# Patient Record
Sex: Male | Born: 1980 | Race: White | Hispanic: No | Marital: Married | Smoking: Former smoker
Health system: Southern US, Community
[De-identification: ages and names within clinical notes are randomized; demographics above are authoritative.]

## PROBLEM LIST (undated history)

## (undated) DIAGNOSIS — N529 Male erectile dysfunction, unspecified: Secondary | ICD-10-CM

## (undated) DIAGNOSIS — Z87442 Personal history of urinary calculi: Secondary | ICD-10-CM

## (undated) DIAGNOSIS — R319 Hematuria, unspecified: Secondary | ICD-10-CM

## (undated) DIAGNOSIS — G43909 Migraine, unspecified, not intractable, without status migrainosus: Secondary | ICD-10-CM

## (undated) HISTORY — PX: ELBOW SURGERY: SHX618

## (undated) HISTORY — PX: APPENDECTOMY: SHX54

## (undated) HISTORY — PX: TONSILLECTOMY: SUR1361

## (undated) HISTORY — DX: Hematuria, unspecified: R31.9

## (undated) HISTORY — DX: Male erectile dysfunction, unspecified: N52.9

---

## 2014-02-18 ENCOUNTER — Emergency Department: Payer: Self-pay | Admitting: Emergency Medicine

## 2014-05-08 ENCOUNTER — Encounter: Payer: Self-pay | Admitting: Family Medicine

## 2014-05-08 DIAGNOSIS — S20219A Contusion of unspecified front wall of thorax, initial encounter: Secondary | ICD-10-CM | POA: Insufficient documentation

## 2014-05-08 DIAGNOSIS — S161XXA Strain of muscle, fascia and tendon at neck level, initial encounter: Secondary | ICD-10-CM | POA: Insufficient documentation

## 2014-06-24 ENCOUNTER — Ambulatory Visit
Admission: EM | Admit: 2014-06-24 | Discharge: 2014-06-24 | Disposition: A | Discharge status: Home / Self Care | Payer: BLUE CROSS/BLUE SHIELD | Attending: Internal Medicine | Admitting: Internal Medicine

## 2014-06-24 DIAGNOSIS — F17210 Nicotine dependence, cigarettes, uncomplicated: Secondary | ICD-10-CM | POA: Diagnosis not present

## 2014-06-24 DIAGNOSIS — R350 Frequency of micturition: Secondary | ICD-10-CM | POA: Diagnosis not present

## 2014-06-24 DIAGNOSIS — R312 Other microscopic hematuria: Secondary | ICD-10-CM | POA: Diagnosis not present

## 2014-06-24 DIAGNOSIS — R3129 Other microscopic hematuria: Secondary | ICD-10-CM

## 2014-06-24 LAB — URINALYSIS COMPLETE WITH MICROSCOPIC (ARMC ONLY)
BACTERIA UA: NONE SEEN — AB
BILIRUBIN URINE: NEGATIVE
Glucose, UA: NEGATIVE mg/dL
Ketones, ur: NEGATIVE mg/dL
LEUKOCYTES UA: NEGATIVE
Nitrite: NEGATIVE
PROTEIN: NEGATIVE mg/dL
SQUAMOUS EPITHELIAL / LPF: NONE SEEN — AB
Specific Gravity, Urine: 1.02 (ref 1.005–1.030)
pH: 8.5 — ABNORMAL HIGH (ref 5.0–8.0)

## 2014-06-24 LAB — GLUCOSE, CAPILLARY: Glucose-Capillary: 83 mg/dL (ref 65–99)

## 2014-06-24 NOTE — ED Provider Notes (Signed)
CSN: [id number]     Arrival date & time 06/24/14  1521 History   First MD Initiated Contact with Patient 06/24/14 1621     Chief Complaint  Patient presents with  . Urinary Frequency   HPI   Well patient, who presents with 3 day history of increased urinary frequency, describes a nearly constant urge to void. He estimates that he has voided 70-80 times in the last 3 or 4 days. The frequency seems to be worse at night. He is drinking a lot of fluids, says that he increased fluids in response to the urinary urgency. No pelvic pressure or discomfort. No dysuria. No hematuria, urine is not cloudy, urine has no odor. No change in bowel movements. No fever. No back pain, no flank pain. He does have fatigue, which he attributes to being up all night going to the bathroom. He has not changed hygiene products. He has decreased caffeine intake; at symptom onset, he was drinking 5-6 "monster" beverages a day. Over-the-counter Azo has not been helpful. He denies perineal rash, penile drainage, scrotal swelling/pain. A grandfather had a lot of trouble with kidney stones. Patient denies new sexual partners, is monogamous with a single male partner and has been for a couple of years.  No past medical history on file. Past Surgical History  Procedure Laterality Date  . Appendectomy    . Tonsillectomy    . Elbow surgery     History reviewed. No pertinent family history. History  Substance Use Topics  . Smoking status: Current Every Day Smoker -- 1.00 packs/day    Types: Cigarettes  . Smokeless tobacco: Not on file  . Alcohol Use: Yes    Review of Systems  All other systems reviewed and are negative.   Allergies  Penicillins and Amoxicillin  Home Medications   Prior to Admission medications   Medication Sig Start Date End Date Taking? Authorizing Provider  phenazopyridine (PYRIDIUM) 95 MG tablet Take 95 mg by mouth 3 (three) times daily as needed for pain.   Yes Historical Provider, MD   cyclobenzaprine (FLEXERIL) 10 MG tablet Take 1 tablet by mouth at bedtime as needed. 03/06/14   Historical Provider, MD  traMADol (ULTRAM) 50 MG tablet Take 1 tablet by mouth every 6 (six) hours as needed. 03/06/14   Historical Provider, MD   There were no vitals taken for this visit. Physical Exam  Constitutional: He is oriented to person, place, and time. No distress.  Alert, nicely groomed  HENT:  Head: Atraumatic.  Eyes:  Conjugate gaze, no eye redness/drainage  Neck: Neck supple.  Cardiovascular: Normal rate and regular rhythm.   Pulmonary/Chest: No respiratory distress. He has no wheezes. He has no rales.  Lungs clear, symmetric breath sounds  Abdominal: Soft. He exhibits no distension. There is no tenderness. There is no guarding.  Genitourinary:  Moderately enlarged, symmetric prostate, no heat, no bogginess, nontender to palpation  Musculoskeletal: Normal range of motion.  Neurological: He is alert and oriented to person, place, and time.  Skin: Skin is warm and dry.  No cyanosis  Nursing note and vitals reviewed.   ED Course  Procedures (including critical care time) Labs Review Labs Reviewed  URINALYSIS COMPLETEWITH MICROSCOPIC ([facility] ONLY) - Abnormal; Notable for the following:    APPearance CLOUDY (*)    Hgb urine dipstick TRACE (*)    pH 8.5 (*)    Bacteria, UA NONE SEEN (*)    Squamous Epithelial / LPF NONE SEEN (*)    All  other components within normal limits  URINE CULTURE  CHLAMYDIA/NGC RT PCR ([facility] ONLY)  GLUCOSE, CAPILLARY  CBG MONITORING, ED   urine micro:  notable for 6-30 RBCs, urine culture is pending  Dirty urine GC/chlamydia assay is pending.  Fingerstick blood glucose 83 (normal)  Imaging Review No results found.   MDM   1. Urinary frequency   2. Microscopic hematuria    Differential diagnosis of urinary frequency includes BPH, bladder irritant, diabetes insipidus, less likely UTI, urethritis (STD). The hematuria suggests  perhaps possible passage of a calculus. Patient should continue to limit caffeine intake, and push fluids. He should follow up with urology for further evaluation. [facility] provided. Urine culture and urine STD testing are pending.    Eustace MooreLaura W Tameyah Koch, MD 06/24/14 [phone]

## 2014-06-24 NOTE — ED Notes (Signed)
Pt states " I have a constant bladder pressure, the urge to pee. I do not have pain but I have urinated 70-80 times in the last 3-4 days."

## 2014-06-24 NOTE — Discharge Instructions (Signed)
Call for appointment with urologist in a week or two, to discuss urinary frequency and recheck urine for microscopic blood.  Push fluids and decrease caffeine (as you are doing).    Urinary Frequency The number of times a normal person urinates depends upon how much liquid they take in and how much liquid they are losing. If the temperature is hot and there is high humidity, then the person will sweat more and usually breathe a little more frequently. These factors decrease the amount of frequency of urination that would be considered normal. The amount you drink is easily determined, but the amount of fluid lost is sometimes more difficult to calculate.  Fluid is lost in two ways:  Sensible fluid loss is usually measured by the amount of urine that you get rid of. Losses of fluid can also occur with diarrhea.  Insensible fluid loss is more difficult to measure. It is caused by evaporation. Insensible loss of fluid occurs through breathing and sweating. It usually ranges from a little less than a quart to a little more than a quart of fluid a day. In normal temperatures and activity levels, the average person may urinate 4 to 7 times in a 24-hour period. Needing to urinate more often than that could indicate a problem. If one urinates 4 to 7 times in 24 hours and has large volumes each time, that could indicate a different problem from one who urinates 4 to 7 times a day and has small volumes. The time of urinating is also important. Most urinating should be done during the waking hours. Getting up at night to urinate frequently can indicate some problems. CAUSES  The bladder is the organ in your lower abdomen that holds urine. Like a balloon, it swells some as it fills up. Your nerves sense this and tell you it is time to head for the bathroom. There are a number of reasons that you might feel the need to urinate more often than usual. They include:  Urinary tract infection. This is usually associated  with other signs such as burning when you urinate.  In men, problems with the prostate (a walnut-size gland that is located near the tube that carries urine out of your body). There are two reasons why the prostate can cause an increased frequency of urination:  An enlarged prostate that does not let the bladder empty well. If the bladder only half empties when you urinate, then it only has half the capacity to fill before you have to urinate again.  The nerves in the bladder become more hypersensitive with an increased size of the prostate even if the bladder empties completely.  Pregnancy.  Obesity. Excess weight is more likely to cause a problem for women than for men.  Bladder stones or other bladder problems.  Caffeine.  Alcohol.  Medications. For example, drugs that help the body get rid of extra fluid (diuretics) increase urine production. Some other medicines must be taken with lots of fluids.  Muscle or nerve weakness. This might be the result of a spinal cord injury, a stroke, multiple sclerosis, or Parkinson disease.  Long-standing diabetes can decrease the sensation of the bladder. This loss of sensation makes it harder to sense the bladder needs to be emptied. Over a period of years, the bladder is stretched out by constant overfilling. This weakens the bladder muscles so that the bladder does not empty well and has less capacity to fill with new urine.  Interstitial cystitis (also called painful  bladder syndrome). This condition develops because the tissues that line the inside of the bladder are inflamed (inflammation is the body's way of reacting to injury or infection). It causes pain and frequent urination. It occurs in women more often than in men. DIAGNOSIS   To decide what might be causing your urinary frequency, your health care provider will probably:  Ask about symptoms you have noticed.  Ask about your overall health. This will include questions about any  medications you are taking.  Do a physical examination.  Order some tests. These might include:  A blood test to check for diabetes or other health issues that could be contributing to the problem.  Urine testing. This could measure the flow of urine and the pressure on the bladder.  A test of your neurological system (the brain, spinal cord, and nerves). This is the system that senses the need to urinate.  A bladder test to check whether it is emptying completely when you urinate.  Cystoscopy. This test uses a thin tube with a tiny camera on it. It offers a look inside your urethra and bladder to see if there are problems.  Imaging tests. You might be given a contrast dye and then asked to urinate. X-rays are taken to see how your bladder is working. TREATMENT  It is important for you to be evaluated to determine if the amount or frequency that you have is unusual or abnormal. If it is found to be abnormal, the cause should be determined and this can usually be found out easily. Depending upon the cause, treatment could include medication, stimulation of the nerves, or surgery. There are not too many things that you can do as an individual to change your urinary frequency. It is important that you balance the amount of fluid intake needed to compensate for your activity and the temperature. Medical problems will be diagnosed and taken care of by your physician. There is no particular bladder training such as Kegel exercises that you can do to help urinary frequency. This is an exercise that is usually recommended for people who have leaking of urine when they laugh, cough, or sneeze. HOME CARE INSTRUCTIONS   Take any medications your health care provider prescribed or suggested. Follow the directions carefully.  Practice any lifestyle changes that are recommended. These might include:  Drinking less fluid or drinking at different times of the day. If you need to urinate often during the  night, for example, you may need to stop drinking fluids early in the evening.  Cutting down on caffeine or alcohol. They both can make you need to urinate more often than normal. Caffeine is found in coffee, tea, and sodas.  Losing weight, if that is recommended.  Keep a journal or a log. You might be asked to record how much you drink and when and where you feel the need to urinate. This will also help evaluate how well the treatment provided by your physician is working. SEEK MEDICAL CARE IF:   Your need to urinate often gets worse.  You feel increased pain or irritation when you urinate.  You notice blood in your urine.  You have questions about any medications that your health care provider recommended.  You notice blood, pus, or swelling at the site of any test or treatment procedure.  You develop a fever of more than 100.35F (38.1C). SEEK IMMEDIATE MEDICAL CARE IF:  You develop a fever of more than 102.60F (38.9C). Document Released: 10/30/2008 Document Revised:  05/20/2013 Document Reviewed: 10/30/2008 ExitCare Patient Information 2015 [employer] [place]. This information is not intended to replace advice given to you by your health care provider. Make sure you discuss any questions you have with your health care provider.

## 2014-06-25 LAB — CHLAMYDIA/NGC RT PCR (ARMC ONLY)
CHLAMYDIA TR: NOT DETECTED
N gonorrhoeae: NOT DETECTED

## 2014-06-26 ENCOUNTER — Telehealth: Payer: Self-pay | Admitting: Urology

## 2014-06-26 LAB — URINE CULTURE: CULTURE: NO GROWTH

## 2014-06-26 NOTE — Telephone Encounter (Signed)
Pt called wanting to be seen ASAP (getting married next week). He had gone to the ER and was diagnosed with frequent urination and micro hemo. He is also starting to have a symptom of pain, feels like being punched in the side a couple days earlier, near where he believes is his kidney. I have scheduled him with Zara Council PA tomorrow @ 9:30am.  06/26/14 maf

## 2014-06-27 ENCOUNTER — Encounter: Payer: Self-pay | Admitting: Urology

## 2014-06-27 ENCOUNTER — Ambulatory Visit (INDEPENDENT_AMBULATORY_CARE_PROVIDER_SITE_OTHER): Payer: BLUE CROSS/BLUE SHIELD | Admitting: Urology

## 2014-06-27 VITALS — BP 120/70 | HR 67 | Ht 71.0 in | Wt 251.1 lb

## 2014-06-27 DIAGNOSIS — R350 Frequency of micturition: Secondary | ICD-10-CM

## 2014-06-27 DIAGNOSIS — R109 Unspecified abdominal pain: Secondary | ICD-10-CM

## 2014-06-27 DIAGNOSIS — R312 Other microscopic hematuria: Secondary | ICD-10-CM | POA: Diagnosis not present

## 2014-06-27 DIAGNOSIS — R3129 Other microscopic hematuria: Secondary | ICD-10-CM

## 2014-06-27 DIAGNOSIS — R1011 Right upper quadrant pain: Secondary | ICD-10-CM | POA: Diagnosis not present

## 2014-06-27 LAB — URINALYSIS, COMPLETE
Bilirubin, UA: NEGATIVE
Glucose, UA: NEGATIVE
Ketones, UA: NEGATIVE
Leukocytes, UA: NEGATIVE
Nitrite, UA: NEGATIVE
PH UA: 5.5 (ref 5.0–7.5)
Protein, UA: NEGATIVE
SPEC GRAV UA: 1.02 (ref 1.005–1.030)
Urobilinogen, Ur: 0.2 mg/dL (ref 0.2–1.0)

## 2014-06-27 LAB — MICROSCOPIC EXAMINATION: Bacteria, UA: NONE SEEN

## 2014-06-27 MED ORDER — TAMSULOSIN HCL 0.4 MG PO CAPS: 0.4000 mg | ORAL_CAPSULE | Freq: Every day | ORAL | Status: DC

## 2014-06-27 NOTE — Patient Instructions (Signed)
Patient is scheduled for a CT Urogram on 06/30/2014 @ 2:30pm.  He is not to eat anything 4 hours prior to the scan.  He is given the # for radiology to reschedule the appointment.

## 2014-06-27 NOTE — Progress Notes (Signed)
06/27/2014 12:48 PM   Glenn Serrano Glenn Serrano 1980-03-17 [id number]  Referring provider: No referring provider defined for this encounter.  Chief Complaint  Patient presents with  . Urinary Frequency  . Hematuria    microscopic  . Flank Pain    patient states pain near kidney (right side)    HPI: Glenn Serrano is a 34 y/o white male who presents with the c/o urinary frequency that started 5 days ago.  He states he was awakened with a strong urge to void.  The frequency persisted throughout the night and increased the next day.  He was evaluated at [facility] on 06/24/2014 and was found to have microscopic hematuria.  His Cx's were negative for UTI and GC/Chlamydia.  He was then referred to [place].  He states two days after the frequency started, he developed right flank pain.  He is also experiencing nocturia, intermittency, hesitancy and a weak stream.  He has ED.  He does not have associated fevers, chills, [place] or visiting.  He denies any gross hematuria or prior h/o stones.   His grandfather and father have a h/o kidney stones.     PMH: Past Medical History  Diagnosis Date  . Hematuria   . Erectile dysfunction     Surgical History: Past Surgical History  Procedure Laterality Date  . Appendectomy    . Tonsillectomy    . Elbow surgery      Home Medications:    Medication List       This list is accurate as of: 06/27/14 12:48 PM.  Always use your most recent med list.               acetaminophen 160 MG chewable tablet  Commonly known as:  TYLENOL  Chew 160 mg by mouth every 6 (six) hours as needed for pain.     cyclobenzaprine 10 MG tablet  Commonly known as:  FLEXERIL  Take 1 tablet by mouth at bedtime as needed.     diazepam 2 MG tablet  Commonly known as:  VALIUM  Take by mouth.     HYDROcodone-acetaminophen 5-325 MG per tablet  Commonly known as:  NORCO/VICODIN  Take by mouth.     naproxen 500 MG tablet  Commonly known as:  NAPROSYN  Take by  mouth.     phenazopyridine 95 MG tablet  Commonly known as:  PYRIDIUM  Take 95 mg by mouth 3 (three) times daily as needed for pain.     predniSONE 10 MG (21) Tbpk tablet  Commonly known as:  STERAPRED UNI-PAK 21 TAB  6 day taper. Take as directed with food     tamsulosin 0.4 MG Caps capsule  Commonly known as:  FLOMAX  Take 1 capsule (0.4 mg total) by mouth daily.     traMADol 50 MG tablet  Commonly known as:  ULTRAM  Take 1 tablet by mouth every 6 (six) hours as needed.        Allergies:  Allergies  Allergen Reactions  . Penicillins Hives  . Amoxicillin Rash    Other reaction(s): Difficulty breathing    Family History: Family History  Problem Relation Age of Onset  . Hematuria      paternal  . Kidney cancer      paternal  . Prostate cancer      paternal  . Kidney failure      paternal    Social History:  reports that he has been smoking Cigarettes.  He has been  smoking about 1.00 pack per day. He does not have any smokeless tobacco history on file. He reports that he drinks alcohol. He reports that he does not use illicit drugs.  ROS: Urological Symptom Review  Patient is experiencing the following symptoms: Frequent urination Get up at night to urinate Stream starts and stops Trouble starting stream Blood in urine Weak stream Erection problems (male only)   Review of Systems  Gastrointestinal (upper)  : Negative for upper GI symptoms  Gastrointestinal (lower) : Diarrhea  Constitutional : Negative for symptoms  Skin: Negative for skin symptoms  Eyes: Negative for eye symptoms  Ear/Nose/Throat : Negative for Ear/Nose/Throat symptoms  Hematologic/Lymphatic: Negative for Hematologic/Lymphatic symptoms  Cardiovascular : Negative for cardiovascular symptoms  Respiratory : Negative for respiratory symptoms  Endocrine: Negative for endocrine symptoms  Musculoskeletal: Negative for musculoskeletal symptoms  Neurological: Negative  for neurological symptoms  Psychologic: Negative for psychiatric symptoms   Physical Exam: BP 120/70 mmHg  Pulse 67  Ht 5\' 11"  (1.803 m)  Wt 251 lb 1.6 oz (113.898 kg)  BMI 35.04 kg/m2  Constitutional:  Alert and oriented, No acute distress. HEENT: [place] AT, moist mucus membranes.  Trachea midline, no masses. Cardiovascular: No clubbing, cyanosis, or edema. Respiratory: Normal respiratory effort, no increased work of breathing. GI: Abdomen is soft, non tender, non distended, no abdominal masses GU: Right CVA tenderness.  Patient with a circumcised phallus.  Urethral meatus is patent.  No penile discharge. No penile lesions or rashes. Scrotum without lesions, cysts, rashes and/or edema.  Testicles are located scrotally bilaterally. No masses are appreciated in the testicles. Left and right epididymis are normal. Rectal: Patient with  normal sphincter tone. Perineum without scarring or rashes. No rectal masses are appreciated. Prostate is approximately 35 grams, no nodules are appreciated. Seminal vesicles are normal. Skin: No rashes, bruises or suspicious lesions. Lymph: No cervical or inguinal adenopathy. Neurologic: Grossly intact, no focal deficits, moving all 4 extremities. Psychiatric: Normal mood and affect.  Laboratory Data: No results found for: WBC, HGB, HCT, MCV, PLT  No results found for: CREATININE  No results found for: PSA  No results found for: TESTOSTERONE  No results found for: HGBA1C  Urinalysis Results for orders placed or performed in visit on 06/27/14  Microscopic Examination  Result Value Ref Range   WBC, UA 0-5 0 -  5 /hpf   RBC, UA 3-10 (A) 0 -  2 /hpf   Epithelial Cells (non renal) 0-10 0 - 10 /hpf   Bacteria, UA None seen None seen/Few  Urinalysis, Complete  Result Value Ref Range   Specific Gravity, UA 1.020 1.005 - 1.030   pH, UA 5.5 5.0 - 7.5   Color, UA Yellow Yellow   Appearance Ur Clear Clear   Leukocytes, UA Negative Negative   Protein,  UA Negative Negative/Trace   Glucose, UA Negative Negative   Ketones, UA Negative Negative   RBC, UA Trace (A) Negative   Bilirubin, UA Negative Negative   Urobilinogen, Ur 0.2 0.2 - 1.0 mg/dL   Nitrite, UA Negative Negative   Microscopic Examination See below:    Pertinent Imaging:   Assessment & Plan:    1. Microscopic hematuria- Patient had 6-30RBCs/hpf on 06/24/2014 and 3-10RBCs/hpf with [place] today.  Explained to patient the causes of blood in the urine are as follows: stones, BPH, UTI's, damage to the urinary tract and/or cancer.  It is explained to the patient that they will be scheduled for a CT Urogram with contrast material  and that in rare instances, an allergic reaction can be serious and even life threatening with the injection of contrast material.   The patient denies any allergies to contrast, iodine and/or seafood and is not taking metformin.  - Urinalysis, Complete  2. Right flank pain-  Patient has a family h/o stones.  He has not had a stone in the past.  His flank pain and micro heme are most likely due to a kidney stone.  I have given him a strainer and instructed him to strain his urine.  If he should capture a fragment, he will bring it into the office for analysis.  I have also prescribed him tamsulosin to help with medical expulsion of a possible stone.    Return in about 1 week (around 07/04/2014) for CT report.  Michiel Cowboy, PA-C  [facility] [address], [address] [place], [place] [address] [phone]   sz

## 2014-06-30 ENCOUNTER — Other Ambulatory Visit: Payer: Self-pay | Admitting: Urology

## 2014-06-30 ENCOUNTER — Ambulatory Visit
Admission: RE | Admit: 2014-06-30 | Discharge: 2014-06-30 | Disposition: A | Discharge status: Home / Self Care | Payer: BLUE CROSS/BLUE SHIELD | Source: Ambulatory Visit | Attending: Urology | Admitting: Urology

## 2014-06-30 DIAGNOSIS — R3129 Other microscopic hematuria: Secondary | ICD-10-CM

## 2014-06-30 DIAGNOSIS — R312 Other microscopic hematuria: Secondary | ICD-10-CM | POA: Diagnosis present

## 2014-06-30 MED ORDER — IOHEXOL 350 MG/ML SOLN
150.0000 mL | Freq: Once | INTRAVENOUS | Status: AC | PRN
  Administered 2014-06-30: 150 mL via INTRAVENOUS

## 2014-07-01 ENCOUNTER — Encounter: Payer: Self-pay | Admitting: Urology

## 2014-07-01 ENCOUNTER — Ambulatory Visit (INDEPENDENT_AMBULATORY_CARE_PROVIDER_SITE_OTHER): Payer: BLUE CROSS/BLUE SHIELD | Admitting: Urology

## 2014-07-01 VITALS — BP 107/67 | HR 93 | Ht 70.5 in | Wt 253.1 lb

## 2014-07-01 DIAGNOSIS — R3129 Other microscopic hematuria: Secondary | ICD-10-CM

## 2014-07-01 DIAGNOSIS — R312 Other microscopic hematuria: Secondary | ICD-10-CM | POA: Diagnosis not present

## 2014-07-01 DIAGNOSIS — K7689 Other specified diseases of liver: Secondary | ICD-10-CM

## 2014-07-01 DIAGNOSIS — K769 Liver disease, unspecified: Secondary | ICD-10-CM

## 2014-07-01 DIAGNOSIS — N201 Calculus of ureter: Secondary | ICD-10-CM

## 2014-07-01 LAB — URINALYSIS, COMPLETE
Bilirubin, UA: NEGATIVE
Glucose, UA: NEGATIVE
LEUKOCYTES UA: NEGATIVE
NITRITE UA: NEGATIVE
Urobilinogen, Ur: 0.2 mg/dL (ref 0.2–1.0)
pH, UA: 6 (ref 5.0–7.5)

## 2014-07-01 LAB — MICROSCOPIC EXAMINATION

## 2014-07-01 MED ORDER — HYDROCODONE-IBUPROFEN 7.5-200 MG PO TABS: 1.0000 | ORAL_TABLET | Freq: Three times a day (TID) | ORAL | Status: DC | PRN

## 2014-07-01 NOTE — Progress Notes (Signed)
11:05 PM   Glenn Serrano 1980/03/27 [id number]  Referring provider: No referring provider defined for this encounter.  Chief Complaint  Patient presents with  . CT results    Patient states in pain    HPI: Glenn Serrano is a 34 y/o white male who presented with the c/o urinary frequency that started one week ago.  He states he was awakened with a strong urge to void.  The frequency persisted throughout the night and increased the next day.  He was evaluated at [facility] on 06/24/2014 and was found to have microscopic hematuria.  His Cx's were negative for UTI and GC/Chlamydia.  He was then referred to [place].  He states two days after the frequency started, he developed right flank pain.  He was also experiencing nocturia, intermittency, hesitancy and a weak stream.    Patient had 6-30RBCs/hpf on 06/24/2014 and 3-10RBCs/hpf with [place] at his last visit.  He underwent CT Urogram for further evaluation of his pain and microscopic hematuria.    He presents today to discuss the findings. He is having intense right groin pain and nausea.  He vomited yesterday during the CT Urogram.  CT scan demonstrated a right distal 3mm ureteral stone with mild hydronephrosis.  It also noted a lesion in his liver.    Patient is getting married on Friday and getting on a plane to [place] for a honeymoon.    PMH: Past Medical History  Diagnosis Date  . Hematuria   . Erectile dysfunction     Surgical History: Past Surgical History  Procedure Laterality Date  . Appendectomy    . Tonsillectomy    . Elbow surgery      Home Medications:    Medication List       This list is accurate as of: 07/01/14 11:59 PM.  Always use your most recent med list.               acetaminophen 160 MG chewable tablet  Commonly known as:  TYLENOL  Chew 160 mg by mouth every 6 (six) hours as needed for pain.     cyclobenzaprine 10 MG tablet  Commonly known as:  FLEXERIL  Take 1 tablet by mouth at  bedtime as needed.     diazepam 2 MG tablet  Commonly known as:  VALIUM  Take by mouth.     HYDROcodone-ibuprofen 7.5-200 MG per tablet  Commonly known as:  VICOPROFEN  Take 1 tablet by mouth every 8 (eight) hours as needed for moderate pain.     naproxen 500 MG tablet  Commonly known as:  NAPROSYN  Take by mouth.     phenazopyridine 95 MG tablet  Commonly known as:  PYRIDIUM  Take 95 mg by mouth 3 (three) times daily as needed for pain.     predniSONE 10 MG (21) Tbpk tablet  Commonly known as:  STERAPRED UNI-PAK 21 TAB  6 day taper. Take as directed with food     tamsulosin 0.4 MG Caps capsule  Commonly known as:  FLOMAX  Take 1 capsule (0.4 mg total) by mouth daily.     traMADol 50 MG tablet  Commonly known as:  ULTRAM  Take 1 tablet by mouth every 6 (six) hours as needed.        Allergies:  Allergies  Allergen Reactions  . Penicillins Hives  . Amoxicillin Rash    Other reaction(s): Difficulty breathing    Family History: Family History  Problem Relation Age  of Onset  . Hematuria      paternal  . Kidney cancer      paternal  . Prostate cancer      paternal  . Kidney failure      paternal    Social History:  reports that he has been smoking Cigarettes.  He has been smoking about 1.00 pack per day. He does not have any smokeless tobacco history on file. He reports that he drinks alcohol. He reports that he does not use illicit drugs.  ROS: Urological Symptom Review  Patient is experiencing the following symptoms: Frequent urination Get up at night to urinate Stream starts and stops Trouble starting stream Blood in urine Weak stream Erection problems (male only)   Review of Systems  Gastrointestinal (upper)  : Negative for upper GI symptoms  Gastrointestinal (lower) : Diarrhea  Constitutional : Negative for symptoms  Skin: Negative for skin symptoms  Eyes: Negative for eye symptoms  Ear/Nose/Throat : Negative for Ear/Nose/Throat  symptoms  Hematologic/Lymphatic: Negative for Hematologic/Lymphatic symptoms  Cardiovascular : Negative for cardiovascular symptoms  Respiratory : Negative for respiratory symptoms  Endocrine: Negative for endocrine symptoms  Musculoskeletal: Negative for musculoskeletal symptoms  Neurological: Negative for neurological symptoms  Psychologic: Negative for psychiatric symptoms   Physical Exam: BP 107/67 mmHg  Pulse 93  Ht 5' 10.5" (1.791 m)  Wt 253 lb 1.6 oz (114.805 kg)  BMI 35.79 kg/m2   Laboratory Data: Results for orders placed or performed in visit on 07/01/14  Microscopic Examination  Result Value Ref Range   WBC, UA 0-5 0 -  5 /hpf   RBC, UA 3-10 (A) 0 -  2 /hpf   Epithelial Cells (non renal) 0-10 0 - 10 /hpf   Mucus, UA Present (A) Not Estab.   Bacteria, UA Few None seen/Few  Urinalysis, Complete  Result Value Ref Range   Specific Gravity, UA >1.030 (H) 1.005 - 1.030   pH, UA 6.0 5.0 - 7.5   Color, UA Yellow Yellow   Appearance Ur Clear Clear   Leukocytes, UA Negative Negative   Protein, UA 1+ (A) Negative/Trace   Glucose, UA Negative Negative   Ketones, UA Trace (A) Negative   RBC, UA Trace (A) Negative   Bilirubin, UA Negative Negative   Urobilinogen, Ur 0.2 0.2 - 1.0 mg/dL   Nitrite, UA Negative Negative   Microscopic Examination See below:    No results found for: WBC, HGB, HCT, MCV, PLT  No results found for: CREATININE  No results found for: PSA  No results found for: TESTOSTERONE  No results found for: HGBA1C  Urinalysis Results for orders placed or performed in visit on 07/01/14  Microscopic Examination  Result Value Ref Range   WBC, UA 0-5 0 -  5 /hpf   RBC, UA 3-10 (A) 0 -  2 /hpf   Epithelial Cells (non renal) 0-10 0 - 10 /hpf   Mucus, UA Present (A) Not Estab.   Bacteria, UA Few None seen/Few  Urinalysis, Complete  Result Value Ref Range   Specific Gravity, UA >1.030 (H) 1.005 - 1.030   pH, UA 6.0 5.0 - 7.5   Color, UA  Yellow Yellow   Appearance Ur Clear Clear   Leukocytes, UA Negative Negative   Protein, UA 1+ (A) Negative/Trace   Glucose, UA Negative Negative   Ketones, UA Trace (A) Negative   RBC, UA Trace (A) Negative   Bilirubin, UA Negative Negative   Urobilinogen, Ur 0.2 0.2 - 1.0 mg/dL  Nitrite, UA Negative Negative   Microscopic Examination See below:    Pertinent Imaging: CLINICAL DATA: 34 year old male with recent history of urinary urgency. Microscopic hematuria. Right-sided flank pain with radiation to the bladder and bilateral testicles for the past 3-4 days. Dysuria.  EXAM: CT ABDOMEN AND PELVIS WITHOUT AND WITH CONTRAST  TECHNIQUE: Multidetector CT imaging of the abdomen and pelvis was performed following the standard protocol before and following the bolus administration of intravenous contrast.  CONTRAST: OMNIPAQUE IOHEXOL 350 MG/ML SOLN  COMPARISON: No priors.  FINDINGS: Lower chest: Unremarkable.  Hepatobiliary: There is a poorly defined low-attenuation lesion in segment 7 of the liver (image 17 of series 8) which measures approximately 2.6 x 1.5 cm and appears to have some subtle peripheral nodular enhancement, presumably a cavernous hemangioma. No other suspicious appearing hepatic lesions are noted. Mild diffuse low attenuation throughout the hepatic parenchyma, compatible with hepatic steatosis. No intra or extrahepatic biliary ductal dilatation. Gallbladder is normal in appearance.  Pancreas: No pancreatic mass. No pancreatic ductal dilatation. No pancreatic or peripancreatic fluid or inflammatory changes.  Spleen: Unremarkable.  Adrenals/Urinary Tract: Image 84 of series 2 demonstrates a 3 mm calculus in the distal third of the right ureter immediately before the right ureterovesicular junction. This is associated with mild proximal hydroureteronephrosis, and a delayed right nephrogram, indicative of obstruction. No additional calculi  are identified within the collecting system of either kidney, along the course of the left ureter, or within the lumen of the urinary bladder. The appearance of the kidneys is otherwise normal. Bilateral adrenal glands are normal in appearance.  Stomach/Bowel: The stomach is normal in appearance. No pathologic dilatation of small bowel or colon.  Vascular/Lymphatic: No significant atherosclerotic disease, aneurysm or dissection identified in the abdominal or pelvic vasculature. No lymphadenopathy noted in the abdomen or pelvis.  Reproductive: Prostate gland and seminal vesicles are unremarkable in appearance.  Other: No significant volume of ascites. No pneumoperitoneum.  Musculoskeletal: There are no aggressive appearing lytic or blastic lesions noted in the visualized portions of the skeleton.  IMPRESSION: 1. 3 mm obstructive calculus in the distal third of the right ureter shortly before the right ureterovesicular junction with mild proximal hydroureteronephrosis. 2. 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 This could be definitively characterized with MRI of the abdomen with without IV gadolinium if of clinical concern. 3. Hepatic steatosis.   Electronically Signed  By: Trudie Reed M.D.  On: 06/30/2014 16:10  Assessment & Plan:     1. Right ureteral stone-  Patient is getting married and leaving for his honeymoon on Friday.  I explained to the patient that the stone was very small and near the urinary bladder. He would have a very good chance of passing it spontaneously. He is to continue the tamsulosin, drink 2.5 L of water a day, strain his urine and drink lemonade/orange juice. He is prescribed Vicodin 7.5/325 mg 1 tablet every 4 hours as needed for pain #30.  He will follow-up in 1 month's time for renal ultrasound, UA and 24-hour urine.  2.   Microscopic hematuria- Patient's microscopic hematuria is most likely due to the ureteral stone. We will continue to monitor his UA. He will return in 1 month for a recheck of his UA for microscopic hematuria.  3. Liver lesion- Patient does not have a PCP. We will obtain an MR of the liver when patient returns in 1  month.  - Urinalysis, Complete  No Follow-up on file.  Michiel Cowboy, PA-C  [facility] [address], [address] [place], [place] [address] [phone]

## 2014-07-02 DIAGNOSIS — Z87442 Personal history of urinary calculi: Secondary | ICD-10-CM | POA: Insufficient documentation

## 2014-07-02 DIAGNOSIS — D1803 Hemangioma of intra-abdominal structures: Secondary | ICD-10-CM | POA: Insufficient documentation

## 2014-07-02 DIAGNOSIS — R3129 Other microscopic hematuria: Secondary | ICD-10-CM | POA: Insufficient documentation

## 2014-07-02 DIAGNOSIS — N201 Calculus of ureter: Secondary | ICD-10-CM | POA: Insufficient documentation

## 2014-07-02 DIAGNOSIS — K769 Liver disease, unspecified: Secondary | ICD-10-CM | POA: Insufficient documentation

## 2014-07-03 LAB — CULTURE, URINE COMPREHENSIVE

## 2014-08-04 ENCOUNTER — Encounter: Payer: Self-pay | Admitting: Urology

## 2014-08-04 ENCOUNTER — Ambulatory Visit: Payer: Self-pay | Admitting: Urology

## 2015-05-29 ENCOUNTER — Ambulatory Visit (INDEPENDENT_AMBULATORY_CARE_PROVIDER_SITE_OTHER): Payer: BLUE CROSS/BLUE SHIELD | Admitting: Family Medicine

## 2015-05-29 ENCOUNTER — Encounter: Payer: Self-pay | Admitting: Family Medicine

## 2015-05-29 VITALS — BP 128/96 | HR 76 | Resp 16 | Ht 70.5 in | Wt 252.0 lb

## 2015-05-29 DIAGNOSIS — M5416 Radiculopathy, lumbar region: Secondary | ICD-10-CM

## 2015-05-29 DIAGNOSIS — Z720 Tobacco use: Secondary | ICD-10-CM | POA: Diagnosis not present

## 2015-05-29 DIAGNOSIS — F17200 Nicotine dependence, unspecified, uncomplicated: Secondary | ICD-10-CM

## 2015-05-29 MED ORDER — NAPROXEN 500 MG PO TABS: 500.0000 mg | ORAL_TABLET | Freq: Two times a day (BID) | ORAL | Status: DC

## 2015-05-29 MED ORDER — CYCLOBENZAPRINE HCL 10 MG PO TABS: 10.0000 mg | ORAL_TABLET | Freq: Three times a day (TID) | ORAL | Status: DC | PRN

## 2015-06-01 DIAGNOSIS — Z87891 Personal history of nicotine dependence: Secondary | ICD-10-CM | POA: Insufficient documentation

## 2015-06-01 DIAGNOSIS — F17200 Nicotine dependence, unspecified, uncomplicated: Secondary | ICD-10-CM | POA: Insufficient documentation

## 2015-06-01 NOTE — Progress Notes (Signed)
Date:  05/29/2015   Name:  Glenn Serrano   DOB:  1980/07/19   MRN:  [id number]  PCP:  No PCP Per Patient    Chief Complaint: Leg Pain and Tailbone Pain   History of Present Illness:  This is a 35 y.o. male with one month hx L lower back pain now radiating to L knee, worse with prolonged sitting or standing, Tylenol/Aleve prn no help. No similar pain in past, no bladder/bowel incontinence.  Review of Systems:  Review of Systems  Constitutional: Negative for fever and fatigue.  Respiratory: Negative for cough and shortness of breath.   Cardiovascular: Negative for chest pain and leg swelling.  Neurological: Negative for syncope and light-headedness.    Patient Active Problem List   Diagnosis Date Noted  . Smoker 06/01/2015  . Microscopic hematuria 07/02/2014  . Ureteral stone 07/02/2014  . Liver lesion 07/02/2014  . Bruised rib 05/08/2014  . Cervical muscle strain 05/08/2014    Prior to Admission medications   Medication Sig Start Date End Date Taking? Authorizing Provider  cyclobenzaprine (FLEXERIL) 10 MG tablet Take 1 tablet (10 mg total) by mouth 3 (three) times daily as needed for muscle spasms. 05/29/15   Schuyler AmorWilliam Sharon Rubis, MD  naproxen (NAPROSYN) 500 MG tablet Take 1 tablet (500 mg total) by mouth 2 (two) times daily with a meal. 05/29/15   Schuyler AmorWilliam Shellby Schlink, MD    Allergies  Allergen Reactions  . Penicillins Hives  . Amoxicillin Rash    Other reaction(s): Difficulty breathing    Past Surgical History  Procedure Laterality Date  . Appendectomy    . Tonsillectomy    . Elbow surgery      Social History  Substance Use Topics  . Smoking status: Current Every Day Smoker -- 1.00 packs/day    Types: Cigarettes  . Smokeless tobacco: None  . Alcohol Use: 0.0 oz/week    0 Standard drinks or equivalent per week     Comment: occasional     Family History  Problem Relation Age of Onset  . Hematuria      paternal  . Kidney cancer      paternal  . Prostate cancer       paternal  . Kidney failure      paternal    Medication list has been reviewed and updated.  Physical Examination: BP 128/96 mmHg  Pulse 76  Resp 16  Ht 5' 10.5" (1.791 m)  Wt 252 lb (114.306 kg)  BMI 35.64 kg/m2  Physical Exam  Constitutional: He appears well-developed and well-nourished.  Cardiovascular: Normal rate, regular rhythm and normal heart sounds.   Pulmonary/Chest: Effort normal and breath sounds normal.  Musculoskeletal: He exhibits no edema.  Negative SLR on right Positive SLR at 45 degrees on left but also pain with bent knee flexion and hip rotation  Neurological: He is alert.  Skin: Skin is warm and dry.  Psychiatric: He has a normal mood and affect. His behavior is normal.  Nursing note and vitals reviewed.   Assessment and Plan:  1. Left lumbar radiculopathy Naprosyn bid and Flexeril prn - Ambulatory referral to Physical Therapy  2. Smoker Encouraged cessation  Return if symptoms worsen or fail to improve.  Dionne AnoWilliam M. Kingsley SpittlePlonk, Jr. MD [facility]  06/01/2015

## 2015-06-02 ENCOUNTER — Telehealth: Payer: Self-pay

## 2015-06-02 ENCOUNTER — Other Ambulatory Visit: Payer: Self-pay | Admitting: Family Medicine

## 2015-06-02 MED ORDER — PREDNISONE 10 MG PO TABS: 10.0000 mg | ORAL_TABLET | Freq: Two times a day (BID) | ORAL | Status: DC

## 2015-06-02 NOTE — Telephone Encounter (Signed)
Sent to [facility] 

## 2015-06-02 NOTE — Telephone Encounter (Signed)
Will send in 5d of prednisone, hold Naprosyn while taking

## 2015-06-02 NOTE — Telephone Encounter (Signed)
Notified pt. 

## 2016-01-22 ENCOUNTER — Encounter: Payer: Self-pay | Admitting: Family Medicine

## 2016-01-22 ENCOUNTER — Ambulatory Visit (INDEPENDENT_AMBULATORY_CARE_PROVIDER_SITE_OTHER): Payer: BLUE CROSS/BLUE SHIELD | Admitting: Family Medicine

## 2016-01-22 VITALS — BP 110/80 | HR 68 | Ht 70.5 in | Wt 248.0 lb

## 2016-01-22 DIAGNOSIS — Z720 Tobacco use: Secondary | ICD-10-CM

## 2016-01-22 DIAGNOSIS — Z716 Tobacco abuse counseling: Secondary | ICD-10-CM | POA: Diagnosis not present

## 2016-01-22 MED ORDER — VARENICLINE TARTRATE 0.5 MG X 11 & 1 MG X 42 PO MISC: ORAL | 0 refills | Status: DC

## 2016-01-22 MED ORDER — VARENICLINE TARTRATE 1 MG PO TABS: 1.0000 mg | ORAL_TABLET | Freq: Two times a day (BID) | ORAL | 1 refills | Status: DC

## 2016-01-22 NOTE — Patient Instructions (Signed)
Steps to Quit Smoking Smoking tobacco can be harmful to your health and can affect almost every organ in your body. Smoking puts you, and those around you, at risk for developing many serious chronic diseases. Quitting smoking is difficult, but it is one of the best things that you can do for your health. It is never too late to quit. What are the benefits of quitting smoking? When you quit smoking, you lower your risk of developing serious diseases and conditions, such as:  Lung cancer or lung disease, such as COPD.  Heart disease.  Stroke.  Heart attack.  Infertility.  Osteoporosis and bone fractures.  Additionally, symptoms such as coughing, wheezing, and shortness of breath may get better when you quit. You may also find that you get sick less often because your body is stronger at fighting off colds and infections. If you are pregnant, quitting smoking can help to reduce your chances of having a baby of low birth weight. How do I get ready to quit? When you decide to quit smoking, create a plan to make sure that you are successful. Before you quit:  Pick a date to quit. Set a date within the next two weeks to give you time to prepare.  Write down the reasons why you are quitting. Keep this list in places where you will see it often, such as on your bathroom mirror or in your car or wallet.  Identify the people, places, things, and activities that make you want to smoke (triggers) and avoid them. Make sure to take these actions: ? Throw away all cigarettes at home, at work, and in your car. ? Throw away smoking accessories, such as ashtrays and lighters. ? Clean your car and make sure to empty the ashtray. ? Clean your home, including curtains and carpets.  Tell your family, friends, and coworkers that you are quitting. Support from your loved ones can make quitting easier.  Talk with your health care provider about your options for quitting smoking.  Find out what treatment  options are covered by your health insurance.  What strategies can I use to quit smoking? Talk with your healthcare provider about different strategies to quit smoking. Some strategies include:  Quitting smoking altogether instead of gradually lessening how much you smoke over a period of time. Research shows that quitting "cold turkey" is more successful than gradually quitting.  Attending in-person counseling to help you build problem-solving skills. You are more likely to have success in quitting if you attend several counseling sessions. Even short sessions of 10 minutes can be effective.  Finding resources and support systems that can help you to quit smoking and remain smoke-free after you quit. These resources are most helpful when you use them often. They can include: ? Online chats with a counselor. ? Telephone quitlines. ? Printed self-help materials. ? Support groups or group counseling. ? Text messaging programs. ? Mobile phone applications.  Taking medicines to help you quit smoking. (If you are pregnant or breastfeeding, talk with your health care provider first.) Some medicines contain nicotine and some do not. Both types of medicines help with cravings, but the medicines that include nicotine help to relieve withdrawal symptoms. Your health care provider may recommend: ? Nicotine patches, gum, or lozenges. ? Nicotine inhalers or sprays. ? Non-nicotine medicine that is taken by mouth.  Talk with your health care provider about combining strategies, such as taking medicines while you are also receiving in-person counseling. Using these two strategies together   makes you more likely to succeed in quitting than if you used either strategy on its own. If you are pregnant or breastfeeding, talk with your health care provider about finding counseling or other support strategies to quit smoking. Do not take medicine to help you quit smoking unless told to do so by your health care  provider. What things can I do to make it easier to quit? Quitting smoking might feel overwhelming at first, but there is a lot that you can do to make it easier. Take these important actions:  Reach out to your family and friends and ask that they support and encourage you during this time. Call telephone quitlines, reach out to support groups, or work with a counselor for support.  Ask people who smoke to avoid smoking around you.  Avoid places that trigger you to smoke, such as bars, parties, or smoke-break areas at work.  Spend time around people who do not smoke.  Lessen stress in your life, because stress can be a smoking trigger for some people. To lessen stress, try: ? Exercising regularly. ? Deep-breathing exercises. ? Yoga. ? Meditating. ? Performing a body scan. This involves closing your eyes, scanning your body from head to toe, and noticing which parts of your body are particularly tense. Purposefully relax the muscles in those areas.  Download or purchase mobile phone or tablet apps (applications) that can help you stick to your quit plan by providing reminders, tips, and encouragement. There are many free apps, such as QuitGuide from the CDC (Centers for Disease Control and Prevention). You can find other support for quitting smoking (smoking cessation) through smokefree.gov and other websites.  How will I feel when I quit smoking? Within the first 24 hours of quitting smoking, you may start to feel some withdrawal symptoms. These symptoms are usually most noticeable 2-3 days after quitting, but they usually do not last beyond 2-3 weeks. Changes or symptoms that you might experience include:  Mood swings.  Restlessness, anxiety, or irritation.  Difficulty concentrating.  Dizziness.  Strong cravings for sugary foods in addition to nicotine.  Mild weight gain.  Constipation.  Nausea.  Coughing or a sore throat.  Changes in how your medicines work in your  body.  A depressed mood.  Difficulty sleeping (insomnia).  After the first 2-3 weeks of quitting, you may start to notice more positive results, such as:  Improved sense of smell and taste.  Decreased coughing and sore throat.  Slower heart rate.  Lower blood pressure.  Clearer skin.  The ability to breathe more easily.  Fewer sick days.  Quitting smoking is very challenging for most people. Do not get discouraged if you are not successful the first time. Some people need to make many attempts to quit before they achieve long-term success. Do your best to stick to your quit plan, and talk with your health care provider if you have any questions or concerns. This information is not intended to replace advice given to you by your health care provider. Make sure you discuss any questions you have with your health care provider. Document Released: 12/28/2000 Document Revised: 09/01/2015 Document Reviewed: 05/20/2014 Elsevier Interactive Patient Education  2017 [employer]  

## 2016-01-22 NOTE — Progress Notes (Signed)
Name: Glenn Serrano   MRN: [id number]    DOB: [birth year]   Date:01/22/2016       Progress Note  Subjective  Chief Complaint  Chief Complaint  Patient presents with  . Establish Care  . Nicotine Dependence    would like to start chantix- has quit on it before    Nicotine Dependence  Presents for initial visit. Symptoms are negative for insomnia and sore throat. Preferred tobacco types include cigarettes. Preferred cigarette types include filtered. His urge triggers include company of smokers, drinking alcohol and stress. Risk factors do not include drinking coffee, driving or perceived media message about smoking.He smokes 1 pack of cigarettes per day. Past treatments include varenicline. The treatment provided complete (little relapse) relief. Compliance with prior treatments has been good. Glenn Serrano is ready to quit. Glenn Serrano has tried to quit 1 time. There is no history of alcohol abuse and drug use.    No problem-specific Assessment & Plan notes found for this encounter.   Past Medical History:  Diagnosis Date  . Erectile dysfunction   . Hematuria     Past Surgical History:  Procedure Laterality Date  . APPENDECTOMY    . ELBOW SURGERY    . TONSILLECTOMY      Family History  Problem Relation Age of Onset  . Hematuria      paternal  . Kidney cancer      paternal  . Prostate cancer      paternal  . Kidney failure      paternal    Social History   Social History  . Marital status: Single    Spouse name: N/A  . Number of children: N/A  . Years of education: N/A   Occupational History  . Not on file.   Social History Main Topics  . Smoking status: Current Every Day Smoker    Packs/day: 1.00    Types: Cigarettes, Cigars  . Smokeless tobacco: Former Neurosurgeon  . Alcohol use 0.0 oz/week     Comment: occasional   . Drug use: No  . Sexual activity: Yes   Other Topics Concern  . Not on file   Social History Narrative  . No narrative on file    Allergies   Allergen Reactions  . Penicillins Hives  . Amoxicillin Rash    Other reaction(s): Difficulty breathing     Review of Systems  Constitutional: Negative for chills, fever, malaise/fatigue and weight loss.  HENT: Negative for ear discharge, ear pain and sore throat.   Eyes: Negative for blurred vision.  Respiratory: Negative for cough, sputum production, shortness of breath and wheezing.   Cardiovascular: Negative for chest pain, palpitations and leg swelling.  Gastrointestinal: Negative for abdominal pain, blood in stool, constipation, diarrhea, heartburn, melena and nausea.  Genitourinary: Negative for dysuria, frequency, hematuria and urgency.  Musculoskeletal: Negative for back pain, joint pain, myalgias and neck pain.  Skin: Negative for rash.  Neurological: Negative for dizziness, tingling, sensory change, focal weakness and headaches.  Endo/Heme/Allergies: Negative for environmental allergies and polydipsia. Does not bruise/bleed easily.  Psychiatric/Behavioral: Negative for depression and suicidal ideas. The patient is not nervous/anxious and does not have insomnia.      Objective  Vitals:   01/22/16 1431  BP: 110/80  Pulse: 68  Weight: 248 lb (112.5 kg)  Height: 5' 10.5" (1.791 m)    Physical Exam  Constitutional: He is oriented to person, place, and time and well-developed, well-nourished, and in no distress.  HENT:  Head: Normocephalic.  Right Ear: External ear normal.  Left Ear: External ear normal.  Nose: Nose normal.  Mouth/Throat: Oropharynx is clear and moist.  Eyes: Conjunctivae and EOM are normal. Pupils are equal, round, and reactive to light. Right eye exhibits no discharge. Left eye exhibits no discharge. No scleral icterus.  Neck: Normal range of motion. Neck supple. No JVD present. No tracheal deviation present. No thyromegaly present.  Cardiovascular: Normal rate, regular rhythm, normal heart sounds and intact distal pulses.  Exam reveals no gallop  and no friction rub.   No murmur heard. Pulmonary/Chest: Breath sounds normal. No respiratory distress. He has no wheezes. He has no rales.  Abdominal: Soft. Bowel sounds are normal. He exhibits no mass. There is no hepatosplenomegaly. There is no tenderness. There is no rebound, no guarding and no CVA tenderness.  Musculoskeletal: Normal range of motion. He exhibits no edema or tenderness.  Lymphadenopathy:    He has no cervical adenopathy.  Neurological: He is alert and oriented to person, place, and time. He has normal sensation, normal strength, normal reflexes and intact cranial nerves. No cranial nerve deficit.  Skin: Skin is warm. No rash noted.  Psychiatric: Mood and affect normal.  Nursing note and vitals reviewed.     Assessment & Plan  Problem List Items Addressed This Visit    None    Visit Diagnoses    Encounter for smoking cessation counseling    -  Primary   Relevant Medications   varenicline (CHANTIX STARTING MONTH PAK) 0.5 MG X 11 & 1 MG X 42 tablet   varenicline (CHANTIX CONTINUING MONTH PAK) 1 MG tablet        Dr. Hayden Rasmusseneanna Trenyce Loera [facility]  01/22/16

## 2017-03-21 ENCOUNTER — Ambulatory Visit: Payer: BLUE CROSS/BLUE SHIELD | Admitting: Family Medicine

## 2017-03-28 ENCOUNTER — Ambulatory Visit: Payer: Self-pay | Admitting: Family Medicine

## 2017-06-13 ENCOUNTER — Ambulatory Visit: Payer: Self-pay | Admitting: Family Medicine

## 2018-08-02 ENCOUNTER — Ambulatory Visit: Payer: Self-pay | Admitting: Otolaryngology

## 2018-08-14 ENCOUNTER — Other Ambulatory Visit
Admission: RE | Admit: 2018-08-14 | Discharge: 2018-08-14 | Disposition: A | Discharge status: Home / Self Care | Payer: BC Managed Care – PPO | Source: Ambulatory Visit | Attending: Otolaryngology | Admitting: Otolaryngology

## 2018-08-14 ENCOUNTER — Encounter: Payer: Self-pay | Admitting: *Deleted

## 2018-08-14 ENCOUNTER — Other Ambulatory Visit: Payer: Self-pay

## 2018-08-14 DIAGNOSIS — Z20828 Contact with and (suspected) exposure to other viral communicable diseases: Secondary | ICD-10-CM | POA: Diagnosis present

## 2018-08-15 LAB — SARS CORONAVIRUS 2 (TAT 6-24 HRS): SARS Coronavirus 2: NEGATIVE

## 2018-08-15 NOTE — Anesthesia Preprocedure Evaluation (Addendum)
Anesthesia Evaluation  Patient identified by MRN, date of birth, ID band Patient awake    Reviewed: Allergy & Precautions, NPO status , Patient's Chart, lab work & pertinent test results  History of Anesthesia Complications Negative for: history of anesthetic complications  Airway Mallampati: II   Neck ROM: Full    Dental  (+)    Pulmonary former smoker (quit 2017),  Current vaping   Pulmonary exam normal breath sounds clear to auscultation       Cardiovascular Exercise Tolerance: Good negative cardio ROS Normal cardiovascular exam Rhythm:Regular Rate:Normal     Neuro/Psych  Headaches, Chronic lower back pain    GI/Hepatic negative GI ROS,   Endo/Other  negative endocrine ROS  Renal/GU negative Renal ROS     Musculoskeletal   Abdominal   Peds  Hematology negative hematology ROS (+)   Anesthesia Other Findings   Reproductive/Obstetrics                            Anesthesia Physical Anesthesia Plan  ASA: II  Anesthesia Plan: General   Post-op Pain Management:    Induction: Intravenous  PONV Risk Score and Plan: 2 and Dexamethasone and Ondansetron  Airway Management Planned: LMA  Additional Equipment:   Intra-op Plan:   Post-operative Plan: Extubation in OR  Informed Consent: I have reviewed the patients History and Physical, chart, labs and discussed the procedure including the risks, benefits and alternatives for the proposed anesthesia with the patient or authorized representative who has indicated his/her understanding and acceptance.       Plan Discussed with: CRNA  Anesthesia Plan Comments:        Anesthesia Quick Evaluation

## 2018-08-16 ENCOUNTER — Ambulatory Visit: Payer: BC Managed Care – PPO | Admitting: Anesthesiology

## 2018-08-16 ENCOUNTER — Ambulatory Visit
Admission: RE | Admit: 2018-08-16 | Discharge: 2018-08-16 | Disposition: A | Discharge status: Home / Self Care | Payer: BC Managed Care – PPO | Attending: Otolaryngology | Admitting: Otolaryngology

## 2018-08-16 ENCOUNTER — Encounter
Admission: RE | Disposition: A | Discharge status: Home / Self Care | Payer: Self-pay | Source: Home / Self Care | Attending: Otolaryngology

## 2018-08-16 ENCOUNTER — Other Ambulatory Visit: Payer: Self-pay

## 2018-08-16 DIAGNOSIS — H6981 Other specified disorders of Eustachian tube, right ear: Secondary | ICD-10-CM | POA: Diagnosis not present

## 2018-08-16 DIAGNOSIS — F17290 Nicotine dependence, other tobacco product, uncomplicated: Secondary | ICD-10-CM | POA: Diagnosis not present

## 2018-08-16 DIAGNOSIS — J343 Hypertrophy of nasal turbinates: Secondary | ICD-10-CM | POA: Diagnosis not present

## 2018-08-16 DIAGNOSIS — H6523 Chronic serous otitis media, bilateral: Secondary | ICD-10-CM | POA: Insufficient documentation

## 2018-08-16 DIAGNOSIS — H73891 Other specified disorders of tympanic membrane, right ear: Secondary | ICD-10-CM | POA: Insufficient documentation

## 2018-08-16 DIAGNOSIS — H902 Conductive hearing loss, unspecified: Secondary | ICD-10-CM | POA: Insufficient documentation

## 2018-08-16 HISTORY — PX: NASOPHARYNGOSCOPY EUSTATION TUBE BALLOON DILATION: SHX6729

## 2018-08-16 HISTORY — DX: Migraine, unspecified, not intractable, without status migrainosus: G43.909

## 2018-08-16 HISTORY — PX: TURBINATE REDUCTION: SHX6157

## 2018-08-16 HISTORY — PX: MYRINGOTOMY WITH TUBE PLACEMENT: SHX5663

## 2018-08-16 SURGERY — MYRINGOTOMY WITH TUBE PLACEMENT
Anesthesia: General | Site: Nose | Laterality: Right

## 2018-08-16 MED ORDER — DEXAMETHASONE SODIUM PHOSPHATE 4 MG/ML IJ SOLN
INTRAMUSCULAR | Status: DC | PRN
  Administered 2018-08-16: 10 mg via INTRAVENOUS

## 2018-08-16 MED ORDER — FENTANYL CITRATE (PF) 100 MCG/2ML IJ SOLN
25.0000 ug | INTRAMUSCULAR | Status: DC | PRN
  Administered 2018-08-16 (×2): 25 ug via INTRAVENOUS

## 2018-08-16 MED ORDER — OXYMETAZOLINE HCL 0.05 % NA SOLN
2.0000 | Freq: Once | NASAL | Status: AC
  Administered 2018-08-16: 07:00:00 2 via NASAL

## 2018-08-16 MED ORDER — CIPRODEX 0.3-0.1 % OT SUSP: 4.0000 [drp] | Freq: Three times a day (TID) | OTIC | 0 refills | Status: DC

## 2018-08-16 MED ORDER — OXYCODONE HCL 5 MG/5ML PO SOLN: 5.0000 mg | Freq: Once | ORAL | Status: AC | PRN

## 2018-08-16 MED ORDER — GLYCOPYRROLATE 0.2 MG/ML IJ SOLN
INTRAMUSCULAR | Status: DC | PRN
  Administered 2018-08-16: .1 mg via INTRAVENOUS

## 2018-08-16 MED ORDER — FENTANYL CITRATE (PF) 100 MCG/2ML IJ SOLN
INTRAMUSCULAR | Status: DC | PRN
  Administered 2018-08-16: 100 ug via INTRAVENOUS

## 2018-08-16 MED ORDER — OXYCODONE HCL 5 MG PO TABS
5.0000 mg | ORAL_TABLET | Freq: Once | ORAL | Status: AC | PRN
  Administered 2018-08-16: 5 mg via ORAL

## 2018-08-16 MED ORDER — LACTATED RINGERS IV SOLN: INTRAVENOUS | Status: DC

## 2018-08-16 MED ORDER — ACETAMINOPHEN 10 MG/ML IV SOLN
1000.0000 mg | Freq: Once | INTRAVENOUS | Status: AC
  Administered 2018-08-16: 07:00:00 1000 mg via INTRAVENOUS

## 2018-08-16 MED ORDER — MIDAZOLAM HCL 5 MG/5ML IJ SOLN
INTRAMUSCULAR | Status: DC | PRN
  Administered 2018-08-16: 2 mg via INTRAVENOUS

## 2018-08-16 MED ORDER — ONDANSETRON HCL 4 MG/2ML IJ SOLN
INTRAMUSCULAR | Status: DC | PRN
  Administered 2018-08-16: 4 mg via INTRAVENOUS

## 2018-08-16 MED ORDER — PHENYLEPHRINE HCL 0.5 % NA SOLN
NASAL | Status: DC | PRN
  Administered 2018-08-16: 30 mL via TOPICAL

## 2018-08-16 MED ORDER — PROPOFOL 10 MG/ML IV BOLUS
INTRAVENOUS | Status: DC | PRN
  Administered 2018-08-16: 200 mg via INTRAVENOUS

## 2018-08-16 MED ORDER — ONDANSETRON HCL 4 MG/2ML IJ SOLN: 4.0000 mg | Freq: Once | INTRAMUSCULAR | Status: DC | PRN

## 2018-08-16 MED ORDER — HYDRALAZINE HCL 20 MG/ML IJ SOLN
10.0000 mg | Freq: Once | INTRAMUSCULAR | Status: AC
  Administered 2018-08-16: 10 mg via INTRAVENOUS

## 2018-08-16 MED ORDER — LACTATED RINGERS IV SOLN
INTRAVENOUS | Status: DC
  Administered 2018-08-16: 07:00:00 via INTRAVENOUS

## 2018-08-16 MED ORDER — LIDOCAINE HCL (CARDIAC) PF 100 MG/5ML IV SOSY
PREFILLED_SYRINGE | INTRAVENOUS | Status: DC | PRN
  Administered 2018-08-16: 40 mg via INTRATRACHEAL

## 2018-08-16 MED ORDER — LIDOCAINE-EPINEPHRINE 1 %-1:100000 IJ SOLN: INTRAMUSCULAR | Status: DC | PRN

## 2018-08-16 MED ORDER — CIPROFLOXACIN-DEXAMETHASONE 0.3-0.1 % OT SUSP
OTIC | Status: DC | PRN
  Administered 2018-08-16: 4 [drp] via OTIC

## 2018-08-16 SURGICAL SUPPLY — 30 items
BALLN CATH EUST TUBE 6X16 (BALLOONS) ×4
BLADE MYR LANCE NRW W/HDL (BLADE) ×4 IMPLANT
CANISTER SUCT 1200ML W/VALVE (MISCELLANEOUS) ×4 IMPLANT
CATH BALLOON EUST TUBE 6X16 (BALLOONS) ×2 IMPLANT
COAGULATOR SUCT 8FR VV (MISCELLANEOUS) ×2 IMPLANT
COTTONBALL LRG STERILE PKG (GAUZE/BANDAGES/DRESSINGS) ×4 IMPLANT
DEVICE INFLATION SEID (MISCELLANEOUS) ×4 IMPLANT
DRAPE HEAD BAR (DRAPES) ×4 IMPLANT
ELECT REM PT RETURN 9FT ADLT (ELECTROSURGICAL) ×4
ELECTRODE REM PT RTRN 9FT ADLT (ELECTROSURGICAL) ×2 IMPLANT
GLOVE PI ULTRA LF STRL 7.5 (GLOVE) ×2 IMPLANT
GLOVE PI ULTRA NON LATEX 7.5 (GLOVE) ×6
GOWN STRL REUS W/ TWL LRG LVL3 (GOWN DISPOSABLE) ×2 IMPLANT
GOWN STRL REUS W/TWL LRG LVL3 (GOWN DISPOSABLE) ×2
NDL ANESTHESIA 27G X 3.5 (NEEDLE) ×2 IMPLANT
NEEDLE ANESTHESIA  27G X 3.5 (NEEDLE) ×2
NEEDLE ANESTHESIA 27G X 3.5 (NEEDLE) ×2 IMPLANT
NS IRRIG 500ML POUR BTL (IV SOLUTION) ×4 IMPLANT
PACK ENT CUSTOM (PACKS) ×4 IMPLANT
PATTIES SURGICAL .5 X3 (DISPOSABLE) ×4 IMPLANT
SOL ANTI-FOG 6CC FOG-OUT (MISCELLANEOUS) ×2 IMPLANT
SOL FOG-OUT ANTI-FOG 6CC (MISCELLANEOUS) ×2
STRAP BODY AND KNEE 60X3 (MISCELLANEOUS) ×4 IMPLANT
SYR 3ML LL SCALE MARK (SYRINGE) ×2 IMPLANT
TOWEL OR 17X26 4PK STRL BLUE (TOWEL DISPOSABLE) ×4 IMPLANT
TUBE EAR ARMSTRONG FL 1.14X4.5 (OTOLOGIC RELATED) ×6 IMPLANT
TUBE EAR T 1.27X4.5 GO LF (OTOLOGIC RELATED) IMPLANT
TUBE EAR T 1.27X5.3 BFLY (OTOLOGIC RELATED) IMPLANT
TUBING CONN 6MMX3.1M (TUBING)
TUBING SUCTION CONN 0.25 STRL (TUBING) ×2 IMPLANT

## 2018-08-16 NOTE — Discharge Instructions (Signed)
[facility] °DISCHARGE INSTRUCTIONS FOR MYRINGOTOMY AND TUBE INSERTION ° °[facility] °PAUL JUENGEL, M.D. °CHAPMAN T. MCQUEEN, M.D. °SCOTT BENNETT, M.D. °CREIGHTON VAUGHT, M.D. ° °Diet:   After surgery, the patient should take only liquids and foods as tolerated.  The patient may then have a regular diet after the effects of anesthesia have worn off, usually about four to six hours after surgery. ° °Activities:   The patient should rest until the effects of anesthesia have worn off.  After this, there are no restrictions on the normal daily activities. ° °Medications:   You will be given antibiotic drops to be used in the ears postoperatively.  It is recommended to use 3 drops 3 times a day for 3 days, then the drops should be saved for possible future use. ° °The tubes should not cause any discomfort to the patient, but if there is any question, Tylenol should be given according to the instructions for the age of the patient. ° °Other medications should be continued normally. ° °Precautions:   Should there be recurrent drainage after the tubes are placed, the drops should be used for approximately 3-4 days.  If it does not clear, you should call the ENT office. ° °Earplugs:   Earplugs are only needed for those who are going to be submerged under water.  When taking a bath or shower and using a cup or showerhead to rinse hair, it is not necessary to wear earplugs.  These come in a variety of fashions, all of which can be obtained at our office.  However, if one is not able to come by the office, then silicone plugs can be found at most pharmacies.  It is not advised to stick anything in the ear that is not approved as an earplug.  Silly putty is not to be used as an earplug.  Swimming is allowed in patients after ear tubes are inserted, however, they must wear earplugs if they are going to be submerged under water.  For those children who are going to be swimming a lot, it is  recommended to use a fitted ear mold, which can be made by our audiologist.  If discharge is noticed from the ears, this most likely represents an ear infection.  We would recommend getting your eardrops and using them as indicated above.  If it does not clear, then you should call the ENT office.  For follow up, the patient should return to the ENT office three weeks postoperatively and then every six months as required by the doctor. ° ° °General Anesthesia, Adult, Care After °This sheet gives you information about how to care for yourself after your procedure. Your health care provider may also give you more specific instructions. If you have problems or questions, contact your health care provider. °What can I expect after the procedure? °After the procedure, the following side effects are common: °· Pain or discomfort at the IV site. °· Nausea. °· Vomiting. °· Sore throat. °· Trouble concentrating. °· Feeling cold or chills. °· Weak or tired. °· Sleepiness and fatigue. °· Soreness and body aches. These side effects can affect parts of the body that were not involved in surgery. °Follow these instructions at home: ° °For at least 24 hours after the procedure: °· Have a responsible adult stay with you. It is important to have someone help care for you until you are awake and alert. °· Rest as needed. °· Do not: °? Participate in   activities in which you could fall or become injured. °? Drive. °? Use heavy machinery. °? Drink alcohol. °? Take sleeping pills or medicines that cause drowsiness. °? Make important decisions or sign legal documents. °? Take care of children on your own. °Eating and drinking °· Follow any instructions from your health care provider about eating or drinking restrictions. °· When you feel hungry, start by eating small amounts of foods that are soft and easy to digest (bland), such as toast. Gradually return to your regular diet. °· Drink enough fluid to keep your urine pale yellow. °· If  you vomit, rehydrate by drinking water, juice, or clear broth. °General instructions °· If you have sleep apnea, surgery and certain medicines can increase your risk for breathing problems. Follow instructions from your health care provider about wearing your sleep device: °? Anytime you are sleeping, including during daytime naps. °? While taking prescription pain medicines, sleeping medicines, or medicines that make you drowsy. °· Return to your normal activities as told by your health care provider. Ask your health care provider what activities are safe for you. °· Take over-the-counter and prescription medicines only as told by your health care provider. °· If you smoke, do not smoke without supervision. °· Keep all follow-up visits as told by your health care provider. This is important. °Contact a health care provider if: °· You have nausea or vomiting that does not get better with medicine. °· You cannot eat or drink without vomiting. °· You have pain that does not get better with medicine. °· You are unable to pass urine. °· You develop a skin rash. °· You have a fever. °· You have redness around your IV site that gets worse. °Get help right away if: °· You have difficulty breathing. °· You have chest pain. °· You have blood in your urine or stool, or you vomit blood. °Summary °· After the procedure, it is common to have a sore throat or nausea. It is also common to feel tired. °· Have a responsible adult stay with you for the first 24 hours after general anesthesia. It is important to have someone help care for you until you are awake and alert. °· When you feel hungry, start by eating small amounts of foods that are soft and easy to digest (bland), such as toast. Gradually return to your regular diet. °· Drink enough fluid to keep your urine pale yellow. °· Return to your normal activities as told by your health care provider. Ask your health care provider what activities are safe for you. °This  information is not intended to replace advice given to you by your health care provider. Make sure you discuss any questions you have with your health care provider. °Document Released: 04/11/2000 Document Revised: 01/06/2017 Document Reviewed: 08/19/2016 °Elsevier Patient Education © 2020 [employer] ° °

## 2018-08-16 NOTE — H&P (Signed)
H&P has been reviewed and patient reevaluated, no changes necessary. To be downloaded later.  

## 2018-08-16 NOTE — Anesthesia Postprocedure Evaluation (Signed)
Anesthesia Post Note  Patient: [name]  Procedure(s) Performed: MYRINGOTOMY WITH TUBE PLACEMENT (Right Ear) NASOPHARYNGOSCOPY EUSTATION TUBE BALLOON DILATION (Bilateral Nose) TURBINATE REDUCTION Outfracture inferior (Bilateral Nose)  Patient location during evaluation: PACU Anesthesia Type: General Level of consciousness: awake and alert, oriented and patient cooperative Pain management: pain level controlled Vital Signs Assessment: post-procedure vital signs reviewed and stable Respiratory status: spontaneous breathing, nonlabored ventilation and respiratory function stable Cardiovascular status: blood pressure returned to baseline and stable Postop Assessment: adequate PO intake Anesthetic complications: no    Darrin Nipper

## 2018-08-16 NOTE — Anesthesia Procedure Notes (Signed)
Procedure Name: LMA Insertion Date/Time: 08/16/2018 7:41 AM Performed by: Mayme Genta, CRNA Pre-anesthesia Checklist: Patient identified, Emergency Drugs available, Suction available, Timeout performed and Patient being monitored Patient Re-evaluated:Patient Re-evaluated prior to induction Oxygen Delivery Method: Circle system utilized Preoxygenation: Pre-oxygenation with 100% oxygen Induction Type: IV induction LMA: LMA inserted LMA Size: 4.0 Number of attempts: 1 Placement Confirmation: positive ETCO2 and breath sounds checked- equal and bilateral Tube secured with: Tape

## 2018-08-16 NOTE — Transfer of Care (Signed)
Immediate Anesthesia Transfer of Care Note  Patient: Glenn Serrano  Procedure(s) Performed: MYRINGOTOMY WITH TUBE PLACEMENT (Right Ear) NASOPHARYNGOSCOPY EUSTATION TUBE BALLOON DILATION (Bilateral Nose) TURBINATE REDUCTION Outfracture inferior (Bilateral Nose)  Patient Location: PACU  Anesthesia Type: General  Level of Consciousness: awake, alert  and patient cooperative  Airway and Oxygen Therapy: Patient Spontanous Breathing and Patient connected to supplemental oxygen  Post-op Assessment: Post-op Vital signs reviewed, Patient's Cardiovascular Status Stable, Respiratory Function Stable, Patent Airway and No signs of Nausea or vomiting  Post-op Vital Signs: Reviewed and stable  Complications: No apparent anesthesia complications

## 2018-08-16 NOTE — Op Note (Signed)
08/16/2018  8:19 AM    Glenn Serrano  [id number]   Pre-Op Dx: Verdie Drown tube dysfunction, chronic serous otitis media, turbinate hypertrophy  Post-op Dx: Eustachian tube dysfunction, chronic serous otitis media with an atelectatic drum on the right side, turbinate hypertrophy  Proc: Right myringotomy and tube, nasopharyngoscopy, bilateral outfracture of the inferior turbinates, eustachian tube balloon dilation  Surg:  Huey Romans  Anes:  LMA  EBL: Minimal  Comp: None  Findings: The right eardrum was stuck down onto the promontory.  Even after making the incision removing thick glue-like fluid from very anterior the eardrum was still stuck to the promontory and would not pull off with suction.   Procedure: The patient was brought to the operating room and placed in supine position.  He was given general anesthesia by laryngeal mask anesthesia.  Once the patient was asleep cottonoid pledgets soaked in phenylephrine and Xylocaine were placed in the nose and both sides.  His right ear was visualized a high-power microscope. it was cleaned of all wax and debris.  The eardrum was atelectatic and was sitting on the promontory.  I used suction and I could not get it to lift from the promontory.  Way anteriorly the eardrum draped up to the sidewall and I made a small incision there.  The eardrum was paperthin.  Behind the eardrum was thick glue-like fluid that was suctioned clear from its most anterior portion.  This left a small air pocket way anteriorly but the eardrum still would not lift off the promontory.  I placed a short Armstrong 5 tube anteriorly into the small hole to try to keep this area open even if the rest of the eardrum does not lift up.  This was followed by Ciprodex drops to coat this area.  Cotton ball was placed in the ear canal.  A 30 degrees scope was used for visualizing the nasal passages on both sides.  The turbinates were vasoconstricted but still enlarged and  blocking a portion of the airway.  A [place] elevator was used to outfracture the inferior turbinates on both sides to open up the airway larger.  I could then placed the scope through with the eustachian tube balloon into the nasopharynx.  The adenoid tissue was not enlarged.  The anatomy here looked normal.  I was able to place the balloon at the opening to the eustachian tube and slide the balloon up into the eustachian tube for its full length.  Once it was in place the balloon was dilated to 12 cm of pressure and allowed to sit for 3 minutes.  Once the time was up the balloon was deflated and was then slid back out of the eustachian tube.  There is no bleeding or trauma from the eustachian tube.  This procedure was then repeated on the left side in a similar fashion.  The balloon went up in the eustachian tube the entire length of the balloon.  It was dilated to 12 cm of pressure for 3 minutes again and then removed without any signs of trauma or bleeding.  The nasal passages were revisited and the airways were open with no signs of bleeding.  The patient tolerated the procedure well.  He was awakened taken to recovery room in satisfactory condition.  There were no operative complications.  Dispo:   To PACU to be discharged home  Plan: To follow-up in the office in a couple weeks to make sure his hearing is better on the right side.  We will see if the eardrum is lifted up any now the tube is in there.  We will see if the eustachian tube is working better on his left side as well.  We will plan to do an audiogram when he comes back to the office.  He will rest at home for the next day or so and can return to work on Monday.  Beverly Sessionsaul H Caylan Schifano  08/16/2018 8:19 AM

## 2018-08-17 ENCOUNTER — Encounter: Payer: Self-pay | Admitting: Otolaryngology

## 2019-08-13 ENCOUNTER — Telehealth: Payer: Self-pay | Admitting: Family Medicine

## 2019-08-13 ENCOUNTER — Encounter: Payer: Self-pay | Admitting: Emergency Medicine

## 2019-08-13 ENCOUNTER — Other Ambulatory Visit: Payer: Self-pay

## 2019-08-13 ENCOUNTER — Emergency Department: Payer: BC Managed Care – PPO

## 2019-08-13 ENCOUNTER — Emergency Department
Admission: EM | Admit: 2019-08-13 | Discharge: 2019-08-13 | Disposition: A | Discharge status: Home / Self Care | Payer: BC Managed Care – PPO | Attending: Emergency Medicine | Admitting: Emergency Medicine

## 2019-08-13 DIAGNOSIS — N201 Calculus of ureter: Secondary | ICD-10-CM | POA: Diagnosis not present

## 2019-08-13 DIAGNOSIS — R109 Unspecified abdominal pain: Secondary | ICD-10-CM | POA: Diagnosis present

## 2019-08-13 DIAGNOSIS — Z792 Long term (current) use of antibiotics: Secondary | ICD-10-CM | POA: Insufficient documentation

## 2019-08-13 DIAGNOSIS — R112 Nausea with vomiting, unspecified: Secondary | ICD-10-CM | POA: Insufficient documentation

## 2019-08-13 DIAGNOSIS — Z87891 Personal history of nicotine dependence: Secondary | ICD-10-CM | POA: Diagnosis not present

## 2019-08-13 DIAGNOSIS — N23 Unspecified renal colic: Secondary | ICD-10-CM

## 2019-08-13 LAB — CBC WITH DIFFERENTIAL/PLATELET
Abs Immature Granulocytes: 0.02 10*3/uL (ref 0.00–0.07)
Basophils Absolute: 0.1 10*3/uL (ref 0.0–0.1)
Basophils Relative: 1 %
Eosinophils Absolute: 0.2 10*3/uL (ref 0.0–0.5)
Eosinophils Relative: 3 %
HCT: 42.9 % (ref 39.0–52.0)
Hemoglobin: 15.3 g/dL (ref 13.0–17.0)
Immature Granulocytes: 0 %
Lymphocytes Relative: 30 %
Lymphs Abs: 1.6 10*3/uL (ref 0.7–4.0)
MCH: 29 pg (ref 26.0–34.0)
MCHC: 35.7 g/dL (ref 30.0–36.0)
MCV: 81.4 fL (ref 80.0–100.0)
Monocytes Absolute: 0.5 10*3/uL (ref 0.1–1.0)
Monocytes Relative: 9 %
Neutro Abs: 3 10*3/uL (ref 1.7–7.7)
Neutrophils Relative %: 57 %
Platelets: 350 10*3/uL (ref 150–400)
RBC: 5.27 MIL/uL (ref 4.22–5.81)
RDW: 14 % (ref 11.5–15.5)
WBC: 5.4 10*3/uL (ref 4.0–10.5)
nRBC: 0 % (ref 0.0–0.2)

## 2019-08-13 LAB — URINALYSIS, COMPLETE (UACMP) WITH MICROSCOPIC
Bacteria, UA: NONE SEEN
Bilirubin Urine: NEGATIVE
Glucose, UA: NEGATIVE mg/dL
Ketones, ur: NEGATIVE mg/dL
Leukocytes,Ua: NEGATIVE
Nitrite: NEGATIVE
Protein, ur: 100 mg/dL — AB
Specific Gravity, Urine: 1.027 (ref 1.005–1.030)
pH: 5 (ref 5.0–8.0)

## 2019-08-13 LAB — BASIC METABOLIC PANEL
Anion gap: 4 — ABNORMAL LOW (ref 5–15)
BUN: 15 mg/dL (ref 6–20)
CO2: 25 mmol/L (ref 22–32)
Calcium: 9 mg/dL (ref 8.9–10.3)
Chloride: 106 mmol/L (ref 98–111)
Creatinine, Ser: 0.98 mg/dL (ref 0.61–1.24)
GFR calc Af Amer: >60 mL/min (ref >60)
GFR calc non Af Amer: >60 mL/min (ref >60)
Glucose, Bld: 115 mg/dL — ABNORMAL HIGH (ref 70–99)
Potassium: 4.1 mmol/L (ref 3.5–5.1)
Sodium: 135 mmol/L (ref 135–145)

## 2019-08-13 MED ORDER — LACTATED RINGERS IV BOLUS
1000.0000 mL | Freq: Once | INTRAVENOUS | Status: AC
  Administered 2019-08-13: 1000 mL via INTRAVENOUS

## 2019-08-13 MED ORDER — HYDROCODONE-ACETAMINOPHEN 5-325 MG PO TABS: 1.0000 | ORAL_TABLET | ORAL | 0 refills | Status: DC | PRN

## 2019-08-13 MED ORDER — ONDANSETRON HCL 4 MG/2ML IJ SOLN
4.0000 mg | Freq: Once | INTRAMUSCULAR | Status: AC
  Administered 2019-08-13: 4 mg via INTRAVENOUS
  Filled 2019-08-13: qty 2

## 2019-08-13 MED ORDER — ONDANSETRON 4 MG PO TBDP: 4.0000 mg | ORAL_TABLET | Freq: Three times a day (TID) | ORAL | 0 refills | Status: DC | PRN

## 2019-08-13 MED ORDER — MORPHINE SULFATE (PF) 4 MG/ML IV SOLN
4.0000 mg | Freq: Once | INTRAVENOUS | Status: AC
  Administered 2019-08-13: 4 mg via INTRAVENOUS
  Filled 2019-08-13: qty 1

## 2019-08-13 NOTE — Telephone Encounter (Signed)
No longer a pt here- not seen since Jan 2018

## 2019-08-13 NOTE — Telephone Encounter (Unsigned)
Copied from CRM [id number]. Topic: Referral - Request for Referral >> Aug 13, 2019  3:54 PM Randol Kern wrote: Has patient seen PCP for this complaint? Yes.   *If NO, is insurance requiring patient see PCP for this issue before PCP can refer them? Referral for which specialty: Urology  Preferred provider/office: [facility] [phone]) Reason for referral: Kidney stone

## 2019-08-13 NOTE — ED Provider Notes (Signed)
[facility] Emergency Department Provider Note   ____________________________________________   First MD Initiated Contact with Patient 08/13/19 1326     (approximate)  I have reviewed the triage vital signs and the nursing notes.   HISTORY  Chief Complaint Flank Pain    HPI Glenn Serrano is a 39 y.o. male with past medical history of kidney stones who presents to the ED complaining of flank pain.  Patient reports that 1 to 2 hours prior to arrival he had sudden onset of sharp stabbing pain around his left flank.  It was severe at onset and did not radiate anywhere, was associated with sudden nausea and an episode of vomiting.  Pain has been waxing and waning since then with further nausea and vomiting he describes it as similar to prior episodes of kidney stones.  He has not had any fevers, cough, chest pain, shortness of breath, abdominal pain, or changes in bowel movements.  He denies any dysuria or hematuria.        Past Medical History:  Diagnosis Date  . Erectile dysfunction   . Hematuria   . Migraine headache    none in last 2 yrs    Patient Active Problem List   Diagnosis Date Noted  . Smoker 06/01/2015  . Ureteral stone 07/02/2014  . Liver lesion 07/02/2014    Past Surgical History:  Procedure Laterality Date  . APPENDECTOMY    . ELBOW SURGERY    . MYRINGOTOMY WITH TUBE PLACEMENT Right 08/16/2018   Procedure: MYRINGOTOMY WITH TUBE PLACEMENT;  Surgeon: Vernie Murders, MD;  Location: [facility];  Service: ENT;  Laterality: Right;  . NASOPHARYNGOSCOPY EUSTATION TUBE BALLOON DILATION Bilateral 08/16/2018   Procedure: NASOPHARYNGOSCOPY EUSTATION TUBE BALLOON DILATION;  Surgeon: Vernie Murders, MD;  Location: [facility];  Service: ENT;  Laterality: Bilateral;  . TONSILLECTOMY    . TURBINATE REDUCTION Bilateral 08/16/2018   Procedure: TURBINATE REDUCTION Outfracture inferior;  Surgeon: Vernie Murders, MD;  Location:  [facility];  Service: ENT;  Laterality: Bilateral;    Prior to Admission medications   Medication Sig Start Date End Date Taking? Authorizing Provider  ciprofloxacin-dexamethasone (CIPRODEX) OTIC suspension Place 4 drops into both ears 3 (three) times daily. 08/16/18   Vernie Murders, MD  HYDROcodone-acetaminophen (NORCO/VICODIN) 5-325 MG tablet Take 1 tablet by mouth every 4 (four) hours as needed for moderate pain. 08/13/19 08/12/20  Chesley Noon, MD  ondansetron (ZOFRAN ODT) 4 MG disintegrating tablet Take 1 tablet (4 mg total) by mouth every 8 (eight) hours as needed for nausea or vomiting. 08/13/19   Chesley Noon, MD    Allergies Amoxicillin and Penicillins  Family History  Problem Relation Age of Onset  . Hematuria Other        paternal  . Kidney cancer Other        paternal  . Prostate cancer Other        paternal  . Kidney failure Other        paternal    Social History Social History   Tobacco Use  . Smoking status: Former Smoker    Packs/day: 1.00    Years: 16.00    Pack years: 16.00    Types: Cigarettes, Cigars    Quit date: 07/18/2015    Years since quitting: 4.0  . Smokeless tobacco: Former Neurosurgeon  . Tobacco comment: July 2017 switched from cigarettes to vaping  Vaping Use  . Vaping Use: Every day  . Start date: 07/18/2015  .  Substances: Nicotine, Flavoring  Substance Use Topics  . Alcohol use: Yes    Alcohol/week: 0.0 standard drinks    Comment: occasional - 1x/month  . Drug use: No    Review of Systems  Constitutional: No fever/chills Eyes: No visual changes. ENT: No sore throat. Cardiovascular: Denies chest pain. Respiratory: Denies shortness of breath. Gastrointestinal: No abdominal pain.  Positive for flank pain.  Positive for nausea and vomiting.  No diarrhea.  No constipation. Genitourinary: Negative for dysuria. Musculoskeletal: Negative for back pain. Skin: Negative for rash. Neurological: Negative for headaches, focal weakness or  numbness.  ____________________________________________   PHYSICAL EXAM:  VITAL SIGNS: ED Triage Vitals  Enc Vitals Group     BP 08/13/19 1202 (!) 133/82     Pulse Rate 08/13/19 1202 49     Resp 08/13/19 1202 18     Temp 08/13/19 1202 98.1 F (36.7 C)     Temp Source 08/13/19 1202 Oral     SpO2 08/13/19 1202 98 %     Weight --      Height --      Head Circumference --      Peak Flow --      Pain Score 08/13/19 1210 8     Pain Loc --      Pain Edu? --      Excl. in GC? --     Constitutional: Alert and oriented. Eyes: Conjunctivae are normal. Head: Atraumatic. Nose: No congestion/rhinnorhea. Mouth/Throat: Mucous membranes are moist. Neck: Normal ROM Cardiovascular: Normal rate, regular rhythm. Grossly normal heart sounds. Respiratory: Normal respiratory effort.  No retractions. Lungs CTAB. Gastrointestinal: Soft and nontender.  No CVA tenderness bilaterally.  No distention. Genitourinary: deferred Musculoskeletal: No lower extremity tenderness nor edema. Neurologic:  Normal speech and language. No gross focal neurologic deficits are appreciated. Skin:  Skin is warm, dry and intact. No rash noted. Psychiatric: Mood and affect are normal. Speech and behavior are normal.  ____________________________________________   LABS (all labs ordered are listed, but only abnormal results are displayed)  Labs Reviewed  BASIC METABOLIC PANEL - Abnormal; Notable for the following components:      Result Value   Glucose, Bld 115 (*)    Anion gap 4 (*)    All other components within normal limits  URINALYSIS, COMPLETE (UACMP) WITH MICROSCOPIC - Abnormal; Notable for the following components:   Color, Urine AMBER (*)    APPearance CLOUDY (*)    Hgb urine dipstick LARGE (*)    Protein, ur 100 (*)    All other components within normal limits  CBC WITH DIFFERENTIAL/PLATELET    PROCEDURES  Procedure(s) performed (including Critical  Care):  Procedures   ____________________________________________   INITIAL IMPRESSION / ASSESSMENT AND PLAN / ED COURSE       39 year old male with past medical history of kidney stones presents to the ED with sudden onset left flank pain that has been waxing and waning since then, similar to prior kidney stones.  We will hydrate with IV fluids, treat with IV morphine and Zofran, check CT scan for nephrolithiasis.  Low suspicion for infection given lack of fever or white count, no CVA tenderness on exam.  UA and additional lab work are pending.  CT shows left nephrolithiasis consistent with etiology of patient's pain.  No evidence of infection on UA and lab work is reassuring.  On reassessment, he states that his pain is much improved and at this time he is appropriate for discharge home with urology  follow-up.  He was counseled to return to the ED for new worsening symptoms, patient agrees with plan.      ____________________________________________   FINAL CLINICAL IMPRESSION(S) / ED DIAGNOSES  Final diagnoses:  Ureteral colic     ED Discharge Orders         Ordered    HYDROcodone-acetaminophen (NORCO/VICODIN) 5-325 MG tablet  Every 4 hours PRN     Discontinue  Reprint     08/13/19 1433    ondansetron (ZOFRAN ODT) 4 MG disintegrating tablet  Every 8 hours PRN     Discontinue  Reprint     08/13/19 1433           Note:  This document was prepared using Dragon voice recognition software and may include unintentional dictation errors.   Chesley Noon, MD 08/13/19 1434

## 2019-08-13 NOTE — ED Notes (Signed)
Pt states having pain that started today in his left back and radiation to his stomach. Patient states pain is 8/10 right now.

## 2019-08-13 NOTE — ED Triage Notes (Signed)
Patient reports left side flank pain that started approx 1 hour prior to arrival. States there is constant dull pain with intermittent sharp, shooting pain. History of kidney stone. Reports nausea.

## 2019-08-13 NOTE — Telephone Encounter (Signed)
Was seen at ER and they should have put in referral, will double check with them

## 2019-08-15 ENCOUNTER — Ambulatory Visit
Admission: RE | Admit: 2019-08-15 | Discharge: 2019-08-15 | Disposition: A | Discharge status: Home / Self Care | Payer: BC Managed Care – PPO | Attending: Urology | Admitting: Urology

## 2019-08-15 ENCOUNTER — Ambulatory Visit
Admission: RE | Admit: 2019-08-15 | Discharge: 2019-08-15 | Disposition: A | Discharge status: Home / Self Care | Payer: BC Managed Care – PPO | Source: Ambulatory Visit | Attending: Urology | Admitting: Urology

## 2019-08-15 ENCOUNTER — Ambulatory Visit (INDEPENDENT_AMBULATORY_CARE_PROVIDER_SITE_OTHER): Payer: BC Managed Care – PPO | Admitting: Urology

## 2019-08-15 ENCOUNTER — Encounter: Payer: Self-pay | Admitting: Urology

## 2019-08-15 ENCOUNTER — Other Ambulatory Visit: Payer: Self-pay

## 2019-08-15 VITALS — BP 165/87 | HR 93 | Ht 71.0 in | Wt 254.9 lb

## 2019-08-15 DIAGNOSIS — N201 Calculus of ureter: Secondary | ICD-10-CM | POA: Diagnosis not present

## 2019-08-15 DIAGNOSIS — N132 Hydronephrosis with renal and ureteral calculous obstruction: Secondary | ICD-10-CM | POA: Diagnosis not present

## 2019-08-15 DIAGNOSIS — N23 Unspecified renal colic: Secondary | ICD-10-CM | POA: Diagnosis not present

## 2019-08-15 MED ORDER — TAMSULOSIN HCL 0.4 MG PO CAPS
0.4000 mg | ORAL_CAPSULE | Freq: Every day | ORAL | 0 refills | Status: DC
Start: 2019-08-15 — End: 2019-09-05

## 2019-08-15 MED ORDER — OXYCODONE-ACETAMINOPHEN 5-325 MG PO TABS: 1.0000 | ORAL_TABLET | Freq: Four times a day (QID) | ORAL | 0 refills | Status: DC | PRN

## 2019-08-15 NOTE — Progress Notes (Signed)
08/15/2019 12:59 PM   Glenn Serrano [birth year] [id number]  Referring provider: Juline Patch, MD [address] [place],  [place] [address]  Chief Complaint  Patient presents with   Nephrolithiasis    HPI: Glenn Serrano is a 39 y.o. male who presents for recent ED follow up renal colic.   ED visit 08/13/2019 for acute onset left flank pain  Severity rated 10/10  No identifiable precipitating, aggravating or alleviating factors  Pain nonradiating  Positive nausea/vomiting  No fever or chills  No voiding symptoms or gross hematuria  Stone protocol CT with 3 mm calculus left proximal ureter with mild hydronephrosis/hydroureter  No other calculi seen  Pain controlled with parenteral analgesics and discharged with Zofran/hydrocodone  No Rx tamsulosin  Since ED visit pain has continued though less severe, intermittent episodes of more severe pain, nausea and vomiting  Prior history stone disease 2016 3 mm right distal ureteral calculus which he passed  PMH: Past Medical History:  Diagnosis Date   Erectile dysfunction    Hematuria    Migraine headache    none in last 2 yrs    Surgical History: Past Surgical History:  Procedure Laterality Date   APPENDECTOMY     ELBOW SURGERY     MYRINGOTOMY WITH TUBE PLACEMENT Right 08/16/2018   Procedure: MYRINGOTOMY WITH TUBE PLACEMENT;  Surgeon: Margaretha Sheffield, MD;  Location: [place];  Service: ENT;  Laterality: Right;   NASOPHARYNGOSCOPY EUSTATION TUBE BALLOON DILATION Bilateral 08/16/2018   Procedure: NASOPHARYNGOSCOPY [place];  Surgeon: Margaretha Sheffield, MD;  Location: [place];  Service: ENT;  Laterality: Bilateral;   TONSILLECTOMY     TURBINATE REDUCTION Bilateral 08/16/2018   Procedure: TURBINATE REDUCTION Outfracture inferior;  Surgeon: Margaretha Sheffield, MD;  Location: [place];  Service: ENT;  Laterality: Bilateral;    Home Medications:    Allergies as of 08/15/2019      Reactions   Amoxicillin Shortness Of Breath, Rash   Penicillins Hives      Medication List       Accurate as of August 15, 2019 12:59 PM. If you have any questions, ask your nurse or doctor.        Ciprodex OTIC suspension Generic drug: ciprofloxacin-dexamethasone Place 4 drops into both ears 3 (three) times daily.   HYDROcodone-acetaminophen 5-325 MG tablet Commonly known as: NORCO/VICODIN Take 1 tablet by mouth every 4 (four) hours as needed for moderate pain.   ondansetron 4 MG disintegrating tablet Commonly known as: Zofran ODT Take 1 tablet (4 mg total) by mouth every 8 (eight) hours as needed for nausea or vomiting.       Allergies:  Allergies  Allergen Reactions   Amoxicillin Shortness Of Breath and Rash   Penicillins Hives    Family History: Family History  Problem Relation Age of Onset   Hematuria Other        paternal   Kidney cancer Other        paternal   Prostate cancer Other        paternal   Kidney failure Other        paternal    Social History:  reports that he quit smoking about 4 years ago. His smoking use included cigarettes and cigars. He has a 16.00 pack-year smoking history. He has quit using smokeless tobacco. He reports current alcohol use. He reports that he does not use drugs.   Physical Exam: BP (!) 165/87    Pulse 93  Ht 5' 11"  (1.803 m)    Wt (!) 254 lb 14.4 oz (115.6 kg)    BMI 35.55 kg/m   Constitutional:  Alert and oriented, No acute distress. HEENT: [place] AT, moist mucus membranes.  Trachea midline, no masses. Cardiovascular: No clubbing, cyanosis, or edema.  RRR Respiratory: Normal respiratory effort, no increased work of breathing.  Clear GI: Abdomen is soft, nontender, nondistended, no abdominal masses GU: No CVA tenderness Skin: No rashes, bruises or suspicious lesions. Neurologic: Grossly intact, no focal deficits, moving all 4 extremities. Psychiatric: Normal mood and  affect.  Laboratory Data: Lab Results  Component Value Date   WBC 5.4 08/13/2019   HGB 15.3 08/13/2019   HCT 42.9 08/13/2019   MCV 81.4 08/13/2019   PLT 350 08/13/2019    Lab Results  Component Value Date   CREATININE 0.98 08/13/2019    Urinalysis    Component Value Date/Time   COLORURINE AMBER (A) 08/13/2019 1314   APPEARANCEUR CLOUDY (A) 08/13/2019 1314   APPEARANCEUR Clear 07/01/2014 1444   LABSPEC 1.027 08/13/2019 1314   PHURINE 5.0 08/13/2019 1314   GLUCOSEU NEGATIVE 08/13/2019 1314   HGBUR LARGE (A) 08/13/2019 1314   BILIRUBINUR NEGATIVE 08/13/2019 1314   BILIRUBINUR Negative 07/01/2014 1444   KETONESUR NEGATIVE 08/13/2019 1314   PROTEINUR 100 (A) 08/13/2019 1314   NITRITE NEGATIVE 08/13/2019 1314   LEUKOCYTESUR NEGATIVE 08/13/2019 1314    Lab Results  Component Value Date   LABMICR See below: 07/01/2014   WBCUA 0-5 07/01/2014   RBCUA 3-10 (A) 07/01/2014   LABEPIT 0-10 07/01/2014   MUCUS Present (A) 07/01/2014   BACTERIA NONE SEEN 08/13/2019    Pertinent Imaging: Images were personally reviewed   CT Renal Stone Study  Narrative CLINICAL DATA:  Left flank pain  EXAM: CT ABDOMEN AND PELVIS WITHOUT CONTRAST  TECHNIQUE: Multidetector CT imaging of the abdomen and pelvis was performed following the standard protocol without IV contrast.  COMPARISON:  2016  FINDINGS: Lower chest: No acute abnormality.  Hepatobiliary: No focal liver abnormality is seen. No gallstones, gallbladder wall thickening, or biliary dilatation.  Pancreas: Unremarkable.  Spleen: Unremarkable.  Adrenals/Urinary Tract: Adrenals are unremarkable. There is a 3 mm obstructing calculus of the proximal left ureter with mild dilatation of the ureteropelvic junction. No renal calculi. The partially distended bladder is unremarkable.  Stomach/Bowel: Stomach is within normal limits. Bowel is normal in caliber.  Vascular/Lymphatic: No significant vascular findings on  this noncontrast study. No enlarged lymph nodes identified.  Reproductive: The prostate is unremarkable.  Other: No ascites.  Abdominal wall is unremarkable.  Musculoskeletal: No acute osseous abnormality.  IMPRESSION: 3 mm obstructing calculus of the proximal left ureter with mild dilatation of the adjacent ureteropelvic junction.   Electronically Signed By: Macy Mis M.D. On: 08/13/2019 14:21   Assessment & Plan:    1. Ureteral stone  KUB today to assess for visualization on plain radiography and stone progression We discussed various treatment options for urolithiasis including observation with or without medical expulsive therapy, shockwave lithotripsy (SWL), ureteroscopy and laser lithotripsy with stent placemen. We discussed that management is based on stone size, location, density, patient co-morbidities, and patient preference.  Stones <54m in size have a >80% spontaneous passage rate. Data surrounding the use of tamsulosin for medical expulsive therapy is controversial, but meta analyses suggests it is most efficacious for distal stones between 5-129min size. Possible side effects include dizziness/lightheadedness, and retrograde ejaculation. SWL has a lower stone free rate in a single procedure,  but also a lower complication rate compared to ureteroscopy and avoids a stent and associated stent related symptoms. Possible complications include renal hematoma, steinstrasse, and need for additional treatment. Ureteroscopy with laser lithotripsy and stent placement has a higher stone free rate than SWL in a single procedure, however increased complication rate including possible infection, ureteral injury, bleeding, and stent related morbidity. Common stent related symptoms include dysuria, urgency/frequency, and flank pain. After an extensive discussion of the risks and benefits of the above treatment options, the patient would like to proceed with MET.  Rx tamsulosin sent  to pharmacy  He is almost out of hydrocodone and Rx oxycodone/APAP sent  If pain persists through weekend he may be interested in either lithotripsy or ureteroscopy   Abbie Sons, MD  [facility] [address], [place], [place] [address] [phone]

## 2019-08-15 NOTE — H&P (View-Only) (Signed)
08/15/2019 12:59 PM   Glenn Serrano Glenn Serrano [birth year] [id number]  Referring provider: Juline Patch, MD [address] [place],  [place] [address]  Chief Complaint  Patient presents with  . Nephrolithiasis    HPI: Glenn Serrano is a 39 y.o. male who presents for recent ED follow up renal colic.   ED visit 08/13/2019 for acute onset left flank pain  Severity rated 10/10  No identifiable precipitating, aggravating or alleviating factors  Pain nonradiating  Positive nausea/vomiting  No fever or chills  No voiding symptoms or gross hematuria  Stone protocol CT with 3 mm calculus left proximal ureter with mild hydronephrosis/hydroureter  No other calculi seen  Pain controlled with parenteral analgesics and discharged with Zofran/hydrocodone  No Rx tamsulosin  Since ED visit pain has continued though less severe, intermittent episodes of more severe pain, nausea and vomiting  Prior history stone disease 2016 3 mm right distal ureteral calculus which he passed  PMH: Past Medical History:  Diagnosis Date  . Erectile dysfunction   . Hematuria   . Migraine headache    none in last 2 yrs    Surgical History: Past Surgical History:  Procedure Laterality Date  . APPENDECTOMY    . ELBOW SURGERY    . MYRINGOTOMY WITH TUBE PLACEMENT Right 08/16/2018   Procedure: MYRINGOTOMY WITH TUBE PLACEMENT;  Surgeon: Margaretha Sheffield, MD;  Location: [place];  Service: ENT;  Laterality: Right;  . NASOPHARYNGOSCOPY EUSTATION TUBE BALLOON DILATION Bilateral 08/16/2018   Procedure: NASOPHARYNGOSCOPY EUSTATION TUBE BALLOON DILATION;  Surgeon: Margaretha Sheffield, MD;  Location: [place];  Service: ENT;  Laterality: Bilateral;  . TONSILLECTOMY    . TURBINATE REDUCTION Bilateral 08/16/2018   Procedure: TURBINATE REDUCTION Outfracture inferior;  Surgeon: Margaretha Sheffield, MD;  Location: [place];  Service: ENT;  Laterality: Bilateral;    Home Medications:    Allergies as of 08/15/2019      Reactions   Amoxicillin Shortness Of Breath, Rash   Penicillins Hives      Medication List       Accurate as of August 15, 2019 12:59 PM. If you have any questions, ask your nurse or doctor.        Ciprodex OTIC suspension Generic drug: ciprofloxacin-dexamethasone Place 4 drops into both ears 3 (three) times daily.   HYDROcodone-acetaminophen 5-325 MG tablet Commonly known as: NORCO/VICODIN Take 1 tablet by mouth every 4 (four) hours as needed for moderate pain.   ondansetron 4 MG disintegrating tablet Commonly known as: Zofran ODT Take 1 tablet (4 mg total) by mouth every 8 (eight) hours as needed for nausea or vomiting.       Allergies:  Allergies  Allergen Reactions  . Amoxicillin Shortness Of Breath and Rash  . Penicillins Hives    Family History: Family History  Problem Relation Age of Onset  . Hematuria Other        paternal  . Kidney cancer Other        paternal  . Prostate cancer Other        paternal  . Kidney failure Other        paternal    Social History:  reports that he quit smoking about 4 years ago. His smoking use included cigarettes and cigars. He has a 16.00 pack-year smoking history. He has quit using smokeless tobacco. He reports current alcohol use. He reports that he does not use drugs.   Physical Exam: BP (!) 165/87   Pulse 93  Ht 5' 11"  (1.803 m)   Wt (!) 254 lb 14.4 oz (115.6 kg)   BMI 35.55 kg/m   Constitutional:  Alert and oriented, No acute distress. HEENT: [place] AT, moist mucus membranes.  Trachea midline, no masses. Cardiovascular: No clubbing, cyanosis, or edema.  RRR Respiratory: Normal respiratory effort, no increased work of breathing.  Clear GI: Abdomen is soft, nontender, nondistended, no abdominal masses GU: No CVA tenderness Skin: No rashes, bruises or suspicious lesions. Neurologic: Grossly intact, no focal deficits, moving all 4 extremities. Psychiatric: Normal mood and  affect.  Laboratory Data: Lab Results  Component Value Date   WBC 5.4 08/13/2019   HGB 15.3 08/13/2019   HCT 42.9 08/13/2019   MCV 81.4 08/13/2019   PLT 350 08/13/2019    Lab Results  Component Value Date   CREATININE 0.98 08/13/2019    Urinalysis    Component Value Date/Time   COLORURINE AMBER (A) 08/13/2019 1314   APPEARANCEUR CLOUDY (A) 08/13/2019 1314   APPEARANCEUR Clear 07/01/2014 1444   LABSPEC 1.027 08/13/2019 1314   PHURINE 5.0 08/13/2019 1314   GLUCOSEU NEGATIVE 08/13/2019 1314   HGBUR LARGE (A) 08/13/2019 1314   BILIRUBINUR NEGATIVE 08/13/2019 1314   BILIRUBINUR Negative 07/01/2014 1444   KETONESUR NEGATIVE 08/13/2019 1314   PROTEINUR 100 (A) 08/13/2019 1314   NITRITE NEGATIVE 08/13/2019 1314   LEUKOCYTESUR NEGATIVE 08/13/2019 1314    Lab Results  Component Value Date   LABMICR See below: 07/01/2014   WBCUA 0-5 07/01/2014   RBCUA 3-10 (A) 07/01/2014   LABEPIT 0-10 07/01/2014   MUCUS Present (A) 07/01/2014   BACTERIA NONE SEEN 08/13/2019    Pertinent Imaging: Images were personally reviewed   CT Renal Stone Study  Narrative CLINICAL DATA:  Left flank pain  EXAM: CT ABDOMEN AND PELVIS WITHOUT CONTRAST  TECHNIQUE: Multidetector CT imaging of the abdomen and pelvis was performed following the standard protocol without IV contrast.  COMPARISON:  2016  FINDINGS: Lower chest: No acute abnormality.  Hepatobiliary: No focal liver abnormality is seen. No gallstones, gallbladder wall thickening, or biliary dilatation.  Pancreas: Unremarkable.  Spleen: Unremarkable.  Adrenals/Urinary Tract: Adrenals are unremarkable. There is a 3 mm obstructing calculus of the proximal left ureter with mild dilatation of the ureteropelvic junction. No renal calculi. The partially distended bladder is unremarkable.  Stomach/Bowel: Stomach is within normal limits. Bowel is normal in caliber.  Vascular/Lymphatic: No significant vascular findings on  this noncontrast study. No enlarged lymph nodes identified.  Reproductive: The prostate is unremarkable.  Other: No ascites.  Abdominal wall is unremarkable.  Musculoskeletal: No acute osseous abnormality.  IMPRESSION: 3 mm obstructing calculus of the proximal left ureter with mild dilatation of the adjacent ureteropelvic junction.   Electronically Signed By: Macy Mis M.D. On: 08/13/2019 14:21   Assessment & Plan:    1. Ureteral stone  KUB today to assess for visualization on plain radiography and stone progression We discussed various treatment options for urolithiasis including observation with or without medical expulsive therapy, shockwave lithotripsy (SWL), ureteroscopy and laser lithotripsy with stent placemen. We discussed that management is based on stone size, location, density, patient co-morbidities, and patient preference.  Stones <30m in size have a >80% spontaneous passage rate. Data surrounding the use of tamsulosin for medical expulsive therapy is controversial, but meta analyses suggests it is most efficacious for distal stones between 5-192min size. Possible side effects include dizziness/lightheadedness, and retrograde ejaculation. SWL has a lower stone free rate in a single procedure, but also  a lower complication rate compared to ureteroscopy and avoids a stent and associated stent related symptoms. Possible complications include renal hematoma, steinstrasse, and need for additional treatment. Ureteroscopy with laser lithotripsy and stent placement has a higher stone free rate than SWL in a single procedure, however increased complication rate including possible infection, ureteral injury, bleeding, and stent related morbidity. Common stent related symptoms include dysuria, urgency/frequency, and flank pain. After an extensive discussion of the risks and benefits of the above treatment options, the patient would like to proceed with MET.  Rx tamsulosin sent  to pharmacy  He is almost out of hydrocodone and Rx oxycodone/APAP sent  If pain persists through weekend he may be interested in either lithotripsy or ureteroscopy   Abbie Sons, MD  [facility] [address] [place], [place] [address] [phone]

## 2019-08-16 LAB — URINALYSIS, COMPLETE
Bilirubin, UA: NEGATIVE
Glucose, UA: NEGATIVE
Ketones, UA: NEGATIVE
Leukocytes,UA: NEGATIVE
Nitrite, UA: NEGATIVE
Protein,UA: NEGATIVE
Specific Gravity, UA: 1.025 (ref 1.005–1.030)
Urobilinogen, Ur: 0.2 mg/dL (ref 0.2–1.0)
pH, UA: 7 (ref 5.0–7.5)

## 2019-08-16 LAB — MICROSCOPIC EXAMINATION: Bacteria, UA: NONE SEEN

## 2019-08-22 ENCOUNTER — Telehealth: Payer: Self-pay | Admitting: Urology

## 2019-08-22 NOTE — Telephone Encounter (Signed)
Stent not definitely seen on KUB.  How has his pain been doing?  If he desires a continued trial of passage would repeat his KUB early next week.  If still having pain we could schedule ureteroscopy next week.

## 2019-08-23 NOTE — Telephone Encounter (Signed)
Patient states he is still having pain in the testicles and lower pelvic area. He is taking pain medication to control the pain. He is feeling pain in the left flank. Patient states he will give it more time, call on Monday if he is not any better.

## 2019-08-25 ENCOUNTER — Encounter: Payer: Self-pay | Admitting: Urology

## 2019-09-05 ENCOUNTER — Telehealth: Payer: Self-pay

## 2019-09-05 ENCOUNTER — Ambulatory Visit
Admission: RE | Admit: 2019-09-05 | Discharge: 2019-09-05 | Disposition: A | Discharge status: Home / Self Care | Payer: BC Managed Care – PPO | Source: Ambulatory Visit | Attending: Physician Assistant | Admitting: Physician Assistant

## 2019-09-05 ENCOUNTER — Encounter: Payer: Self-pay | Admitting: Physician Assistant

## 2019-09-05 ENCOUNTER — Other Ambulatory Visit: Payer: Self-pay

## 2019-09-05 ENCOUNTER — Ambulatory Visit (INDEPENDENT_AMBULATORY_CARE_PROVIDER_SITE_OTHER): Payer: BC Managed Care – PPO | Admitting: Physician Assistant

## 2019-09-05 ENCOUNTER — Other Ambulatory Visit: Payer: Self-pay | Admitting: Radiology

## 2019-09-05 ENCOUNTER — Ambulatory Visit
Admission: RE | Admit: 2019-09-05 | Discharge: 2019-09-05 | Disposition: A | Discharge status: Home / Self Care | Payer: BC Managed Care – PPO | Attending: Physician Assistant | Admitting: Physician Assistant

## 2019-09-05 VITALS — BP 153/81 | HR 54

## 2019-09-05 DIAGNOSIS — N201 Calculus of ureter: Secondary | ICD-10-CM

## 2019-09-05 MED ORDER — TAMSULOSIN HCL 0.4 MG PO CAPS: 0.4000 mg | ORAL_CAPSULE | Freq: Every day | ORAL | 1 refills | Status: DC

## 2019-09-05 MED ORDER — KETOROLAC TROMETHAMINE 60 MG/2ML IM SOLN
60.0000 mg | Freq: Once | INTRAMUSCULAR | Status: AC
  Administered 2019-09-05: 60 mg via INTRAMUSCULAR

## 2019-09-05 MED ORDER — OXYCODONE-ACETAMINOPHEN 5-325 MG PO TABS: 1.0000 | ORAL_TABLET | Freq: Four times a day (QID) | ORAL | 0 refills | Status: DC | PRN

## 2019-09-05 MED ORDER — ONDANSETRON 4 MG PO TBDP: 4.0000 mg | ORAL_TABLET | Freq: Three times a day (TID) | ORAL | 1 refills | Status: DC | PRN

## 2019-09-05 MED ORDER — KETOROLAC TROMETHAMINE 15.75 MG/SPRAY NA SOLN: 2.0000 | Freq: Four times a day (QID) | NASAL | 0 refills | Status: DC | PRN

## 2019-09-05 MED ORDER — PROMETHAZINE HCL 25 MG/ML IJ SOLN
25.0000 mg | Freq: Once | INTRAMUSCULAR | Status: AC
Start: 2019-09-05 — End: 2019-09-05
  Administered 2019-09-05: 25 mg via INTRAMUSCULAR

## 2019-09-05 NOTE — Patient Instructions (Signed)
[facility] will call you to arrange delivery of your nasal spray pain medication.

## 2019-09-05 NOTE — Telephone Encounter (Signed)
Patient and wife called today stating he is having extreme pain, nausea and vomiting. He believes he is passing the stone and is currently out of pain medication and tamsulosin. Follow up for tomorrow was moved up to today with PA and KUB prior per Dr. Lonna Cobb.

## 2019-09-05 NOTE — Progress Notes (Signed)
IM Injection  Patient is present today for an IM Injection for vomiting Drug: Promethazine Dose:60ml/1ml Location:left arm Lot: [id number] Exp:01/2020 Patient tolerated well, no complications were noted  Preformed by: Eligha Bridegroom, CMA  IM Injection  Patient is present today for an IM Injection flank pain Drug: Ketoroalc Dose:60mg / 60ml Location: Left buttocks Lot: [id number] Exp:11/2020 Patient tolerated well, no complications were noted  Preformed by: Eligha Bridegroom, CMA     \

## 2019-09-05 NOTE — Progress Notes (Signed)
 09/05/2019 10:24 AM   Glenn Serrano 05/17/1980 [id number]  CC: Chief Complaint  Patient presents with  . Nephrolithiasis   HPI: Glenn Serrano is a 39 y.o. male with a known 3 mm left ureteral stone who presents today as a same-day add-on reporting severe left flank pain, testicular pain, nausea, and vomiting.  He was seen in clinic most recently by Dr. Stoioff on 08/15/2019 and started on MET with Flomax.  Today he reports sudden onset of 20/10 left flank pain, nausea, vomiting, and testicular pain approximately 4 hours ago.  He has run out of his prescribed Percocet and Zofran.  He previously was able to manage his pain reasonably well on these at home.  KUB today with a very subtle area of radiopacity in the region of the distal left ureter that may represent a distal left ureteral stone.  In-office UA today positive for 3+ blood and 2+ protein; urine microscopy with 11-30 RBCs/HPF and calcium oxalate crystals.  PMH: Past Medical History:  Diagnosis Date  . Erectile dysfunction   . Hematuria   . Migraine headache    none in last 2 yrs    Surgical History: Past Surgical History:  Procedure Laterality Date  . APPENDECTOMY    . ELBOW SURGERY    . MYRINGOTOMY WITH TUBE PLACEMENT Right 08/16/2018   Procedure: MYRINGOTOMY WITH TUBE PLACEMENT;  Surgeon: Juengel, Paul, MD;  Location: [facility];  Service: ENT;  Laterality: Right;  . NASOPHARYNGOSCOPY EUSTATION TUBE BALLOON DILATION Bilateral 08/16/2018   Procedure: NASOPHARYNGOSCOPY EUSTATION TUBE BALLOON DILATION;  Surgeon: Juengel, Paul, MD;  Location: [facility];  Service: ENT;  Laterality: Bilateral;  . TONSILLECTOMY    . TURBINATE REDUCTION Bilateral 08/16/2018   Procedure: TURBINATE REDUCTION Outfracture inferior;  Surgeon: Juengel, Paul, MD;  Location: [facility];  Service: ENT;  Laterality: Bilateral;    Home Medications:  Allergies as of 09/05/2019      Reactions   Amoxicillin  Shortness Of Breath, Rash   Penicillins Hives      Medication List       Accurate as of September 05, 2019 11:59 PM. If you have any questions, ask your nurse or doctor.        STOP taking these medications   Ciprodex OTIC suspension Generic drug: ciprofloxacin-dexamethasone Stopped by: Samantha Vaillancourt, PA-C   HYDROcodone-acetaminophen 5-325 MG tablet Commonly known as: NORCO/VICODIN Stopped by: Samantha Vaillancourt, PA-C     TAKE these medications   Ketorolac Tromethamine 15.75 MG/SPRAY Soln Place 2 sprays into the nose every 6 (six) hours as needed for up to 5 days. Started by: Samantha Vaillancourt, PA-C   ondansetron 4 MG disintegrating tablet Commonly known as: Zofran ODT Take 1 tablet (4 mg total) by mouth every 8 (eight) hours as needed for nausea or vomiting.   oxyCODONE-acetaminophen 5-325 MG tablet Commonly known as: PERCOCET/ROXICET Take 1-2 tablets by mouth every 6 (six) hours as needed for severe pain.   tamsulosin 0.4 MG Caps capsule Commonly known as: FLOMAX Take 1 capsule (0.4 mg total) by mouth daily after breakfast.       Allergies:  Allergies  Allergen Reactions  . Amoxicillin Shortness Of Breath and Rash  . Penicillins Hives    Family History: Family History  Problem Relation Age of Onset  . Hematuria Other        paternal  . Kidney cancer Other        paternal  . Prostate cancer Other          paternal  . Kidney failure Other        paternal    Social History:   reports that he quit smoking about 4 years ago. His smoking use included cigarettes and cigars. He has a 16.00 pack-year smoking history. He has quit using smokeless tobacco. He reports current alcohol use. He reports that he does not use drugs.  Physical Exam: BP (!) 153/81   Pulse (!) 54   Constitutional:  Alert and oriented, nontoxic appearing, uncomfortable appearing, lying still in the fetal position HEENT: [place], AT Cardiovascular: No clubbing, cyanosis, or  edema Respiratory: Normal respiratory effort, no increased work of breathing Skin: No rashes, bruises or suspicious lesions Neurologic: Grossly intact, no focal deficits, moving all 4 extremities Psychiatric: Normal mood and affect  Laboratory Data: Results for orders placed or performed in visit on 09/05/19  Microscopic Examination   Urine  Result Value Ref Range   WBC, UA 0-5 0 - 5 /hpf   RBC 11-30 (A) 0 - 2 /hpf   Epithelial Cells (non renal) 0-10 0 - 10 /hpf   Crystals Present (A) N/A   Crystal Type Calcium Oxalate N/A   Mucus, UA Present (A) Not Estab.   Bacteria, UA None seen None seen/Few  Urinalysis, Complete  Result Value Ref Range   Specific Gravity, UA >1.030 (H) 1.005 - 1.030   pH, UA 5.5 5.0 - 7.5   Color, UA Yellow Yellow   Appearance Ur Cloudy (A) Clear   Leukocytes,UA Negative Negative   Protein,UA 2+ (A) Negative/Trace   Glucose, UA Negative Negative   Ketones, UA Negative Negative   RBC, UA 3+ (A) Negative   Bilirubin, UA Negative Negative   Urobilinogen, Ur 0.2 0.2 - 1.0 mg/dL   Nitrite, UA Negative Negative   Microscopic Examination See below:    Pertinent Imaging: KUB, 09/05/2019: CLINICAL DATA:  Nephrolithiasis right lower quadrant pain  EXAM: ABDOMEN - 1 VIEW  COMPARISON:  08/15/2019, CT 08/13/2019  FINDINGS: Calcified phleboliths in the right pelvis. Nonobstructed gas pattern. The previously noted proximal left ureteral stone is not clearly identified on today's study.  IMPRESSION: Previously noted proximal left ureteral stone is not clearly identified on today's study. Nonobstructed bowel-gas pattern   Electronically Signed   By: Kim  Fujinaga M.D.   On: 09/05/2019 20:42 Results for orders placed during the hospital encounter of 08/15/19  Abdomen 1 view (KUB)  Narrative CLINICAL DATA:  Left flank pain for 3 days.  Kidney stone.  EXAM: ABDOMEN - 1 VIEW  COMPARISON:  CT 2 days prior 08/13/2019  FINDINGS: The previous 6  mm stone in the left proximal ureter is tentatively visualized in the same location at the level of L1. No other stones project over the renal beds or course of the ureters. Pelvic calcifications are unchanged and consistent with phleboliths. Small disc calcification at L1-L2. Normal bowel gas pattern.  IMPRESSION: Previous 6 mm left proximal ureteral stone is tentatively visualized in the same location as recent CT at the level of L1.   Electronically Signed By: Melanie  Sanford M.D. On: 08/15/2019 20:47  I personally reviewed the images referenced above and note no apparent proximal left ureteral stone.  Assessment & Plan:   1. Ureteral stone 39-year-old male with a known 3 mm left ureteral stone presents today with sudden onset of severe left flank pain, nausea, vomiting, and testicular pain.  No apparent stone on KUB, however stone was very subtle on prior KUB.  UA reassuring for infection, VSS.    Based on his symptoms, I suspect the stone has moved distally and passage may be imminent.  We administered Phenergan and Toradol in clinic today for symptom management with significant improvement in his symptoms.  At this point, I had a lengthy conversation with the patient and his wife.  I explained that there remains a greater than 80% chance of spontaneous passage of a 3 mm stone.  I offered him continued MET with refills of antiemetics and narcotic pain medication with plans for left ureteroscopy with laser lithotripsy and stent placement with Dr. Stoioff on 09/10/2019.  Patient is in agreement with this plan.  I am refilling tamsulosin, Percocet, Zofran, and prescribing Sprix at this time for symptom management in advance of planned procedure.  Counseled patient to contact our office if he passes the stone in the interim.  He expressed understanding.  Additionally, I reviewed the risks of ureteroscopy including bleeding, infection, and damage to surrounding structures.  I explained that  his urine today is reassuring for infection, and we will send his urine for culture today to confirm.  Lastly, we reviewed common symptoms of stent discomfort including gross hematuria, flank pain, groin pain, dysuria, urgency, and frequency.  He expressed understanding and wishes to proceed. - Urinalysis, Complete - promethazine (PHENERGAN) injection 25 mg - ketorolac (TORADOL) injection 60 mg - CULTURE, URINE COMPREHENSIVE - tamsulosin (FLOMAX) 0.4 MG CAPS capsule; Take 1 capsule (0.4 mg total) by mouth daily after breakfast.  Dispense: 14 capsule; Refill: 1 - oxyCODONE-acetaminophen (PERCOCET/ROXICET) 5-325 MG tablet; Take 1-2 tablets by mouth every 6 (six) hours as needed for severe pain.  Dispense: 20 tablet; Refill: 0 - ondansetron (ZOFRAN ODT) 4 MG disintegrating tablet; Take 1 tablet (4 mg total) by mouth every 8 (eight) hours as needed for nausea or vomiting.  Dispense: 15 tablet; Refill: 1 - Ketorolac Tromethamine 15.75 MG/SPRAY SOLN; Place 2 sprays into the nose every 6 (six) hours as needed for up to 5 days.  Dispense: 1 each; Refill: 0   Return in about 5 days (around 09/10/2019) for Left URS/LL/stent with Dr. Stoioff.  Samantha Vaillancourt, PA-C  [facility] [address], [address] [place],  [address] [phone]    

## 2019-09-05 NOTE — H&P (View-Only) (Signed)
 09/05/2019 10:24 AM   Glenn Serrano 09/06/1980 [id number]  CC: Chief Complaint  Patient presents with  . Nephrolithiasis   HPI: Glenn Serrano is a 39 y.o. male with a known 3 mm left ureteral stone who presents today as a same-day add-on reporting severe left flank pain, testicular pain, nausea, and vomiting.  He was seen in clinic most recently by Dr. Stoioff on 08/15/2019 and started on MET with Flomax.  Today he reports sudden onset of 20/10 left flank pain, nausea, vomiting, and testicular pain approximately 4 hours ago.  He has run out of his prescribed Percocet and Zofran.  He previously was able to manage his pain reasonably well on these at home.  KUB today with a very subtle area of radiopacity in the region of the distal left ureter that may represent a distal left ureteral stone.  In-office UA today positive for 3+ blood and 2+ protein; urine microscopy with 11-30 RBCs/HPF and calcium oxalate crystals.  PMH: Past Medical History:  Diagnosis Date  . Erectile dysfunction   . Hematuria   . Migraine headache    none in last 2 yrs    Surgical History: Past Surgical History:  Procedure Laterality Date  . APPENDECTOMY    . ELBOW SURGERY    . MYRINGOTOMY WITH TUBE PLACEMENT Right 08/16/2018   Procedure: MYRINGOTOMY WITH TUBE PLACEMENT;  Surgeon: Juengel, Paul, MD;  Location: [facility];  Service: ENT;  Laterality: Right;  . NASOPHARYNGOSCOPY EUSTATION TUBE BALLOON DILATION Bilateral 08/16/2018   Procedure: NASOPHARYNGOSCOPY EUSTATION TUBE BALLOON DILATION;  Surgeon: Juengel, Paul, MD;  Location: [facility];  Service: ENT;  Laterality: Bilateral;  . TONSILLECTOMY    . TURBINATE REDUCTION Bilateral 08/16/2018   Procedure: TURBINATE REDUCTION Outfracture inferior;  Surgeon: Juengel, Paul, MD;  Location: [facility];  Service: ENT;  Laterality: Bilateral;    Home Medications:  Allergies as of 09/05/2019      Reactions   Amoxicillin  Shortness Of Breath, Rash   Penicillins Hives      Medication List       Accurate as of September 05, 2019 11:59 PM. If you have any questions, ask your nurse or doctor.        STOP taking these medications   Ciprodex OTIC suspension Generic drug: ciprofloxacin-dexamethasone Stopped by: Littie Chiem, PA-C   HYDROcodone-acetaminophen 5-325 MG tablet Commonly known as: NORCO/VICODIN Stopped by: Hersey Maclellan, PA-C     TAKE these medications   Ketorolac Tromethamine 15.75 MG/SPRAY Soln Place 2 sprays into the nose every 6 (six) hours as needed for up to 5 days. Started by: Rini Moffit, PA-C   ondansetron 4 MG disintegrating tablet Commonly known as: Zofran ODT Take 1 tablet (4 mg total) by mouth every 8 (eight) hours as needed for nausea or vomiting.   oxyCODONE-acetaminophen 5-325 MG tablet Commonly known as: PERCOCET/ROXICET Take 1-2 tablets by mouth every 6 (six) hours as needed for severe pain.   tamsulosin 0.4 MG Caps capsule Commonly known as: FLOMAX Take 1 capsule (0.4 mg total) by mouth daily after breakfast.       Allergies:  Allergies  Allergen Reactions  . Amoxicillin Shortness Of Breath and Rash  . Penicillins Hives    Family History: Family History  Problem Relation Age of Onset  . Hematuria Other        paternal  . Kidney cancer Other        paternal  . Prostate cancer Other          paternal  . Kidney failure Other        paternal    Social History:   reports that he quit smoking about 4 years ago. His smoking use included cigarettes and cigars. He has a 16.00 pack-year smoking history. He has quit using smokeless tobacco. He reports current alcohol use. He reports that he does not use drugs.  Physical Exam: BP (!) 153/81   Pulse (!) 54   Constitutional:  Alert and oriented, nontoxic appearing, uncomfortable appearing, lying still in the fetal position HEENT: [place], AT Cardiovascular: No clubbing, cyanosis, or  edema Respiratory: Normal respiratory effort, no increased work of breathing Skin: No rashes, bruises or suspicious lesions Neurologic: Grossly intact, no focal deficits, moving all 4 extremities Psychiatric: Normal mood and affect  Laboratory Data: Results for orders placed or performed in visit on 09/05/19  Microscopic Examination   Urine  Result Value Ref Range   WBC, UA 0-5 0 - 5 /hpf   RBC 11-30 (A) 0 - 2 /hpf   Epithelial Cells (non renal) 0-10 0 - 10 /hpf   Crystals Present (A) N/A   Crystal Type Calcium Oxalate N/A   Mucus, UA Present (A) Not Estab.   Bacteria, UA None seen None seen/Few  Urinalysis, Complete  Result Value Ref Range   Specific Gravity, UA >1.030 (H) 1.005 - 1.030   pH, UA 5.5 5.0 - 7.5   Color, UA Yellow Yellow   Appearance Ur Cloudy (A) Clear   Leukocytes,UA Negative Negative   Protein,UA 2+ (A) Negative/Trace   Glucose, UA Negative Negative   Ketones, UA Negative Negative   RBC, UA 3+ (A) Negative   Bilirubin, UA Negative Negative   Urobilinogen, Ur 0.2 0.2 - 1.0 mg/dL   Nitrite, UA Negative Negative   Microscopic Examination See below:    Pertinent Imaging: KUB, 09/05/2019: CLINICAL DATA:  Nephrolithiasis right lower quadrant pain  EXAM: ABDOMEN - 1 VIEW  COMPARISON:  08/15/2019, CT 08/13/2019  FINDINGS: Calcified phleboliths in the right pelvis. Nonobstructed gas pattern. The previously noted proximal left ureteral stone is not clearly identified on today's study.  IMPRESSION: Previously noted proximal left ureteral stone is not clearly identified on today's study. Nonobstructed bowel-gas pattern   Electronically Signed   By: Kim  Fujinaga M.D.   On: 09/05/2019 20:42 Results for orders placed during the hospital encounter of 08/15/19  Abdomen 1 view (KUB)  Narrative CLINICAL DATA:  Left flank pain for 3 days.  Kidney stone.  EXAM: ABDOMEN - 1 VIEW  COMPARISON:  CT 2 days prior 08/13/2019  FINDINGS: The previous 6  mm stone in the left proximal ureter is tentatively visualized in the same location at the level of L1. No other stones project over the renal beds or course of the ureters. Pelvic calcifications are unchanged and consistent with phleboliths. Small disc calcification at L1-L2. Normal bowel gas pattern.  IMPRESSION: Previous 6 mm left proximal ureteral stone is tentatively visualized in the same location as recent CT at the level of L1.   Electronically Signed By: Melanie  Sanford M.D. On: 08/15/2019 20:47  I personally reviewed the images referenced above and note no apparent proximal left ureteral stone.  Assessment & Plan:   1. Ureteral stone 39-year-old male with a known 3 mm left ureteral stone presents today with sudden onset of severe left flank pain, nausea, vomiting, and testicular pain.  No apparent stone on KUB, however stone was very subtle on prior KUB.  UA reassuring for infection, VSS.    Based on his symptoms, I suspect the stone has moved distally and passage may be imminent.  We administered Phenergan and Toradol in clinic today for symptom management with significant improvement in his symptoms.  At this point, I had a lengthy conversation with the patient and his wife.  I explained that there remains a greater than 80% chance of spontaneous passage of a 3 mm stone.  I offered him continued MET with refills of antiemetics and narcotic pain medication with plans for left ureteroscopy with laser lithotripsy and stent placement with Dr. Stoioff on 09/10/2019.  Patient is in agreement with this plan.  I am refilling tamsulosin, Percocet, Zofran, and prescribing Sprix at this time for symptom management in advance of planned procedure.  Counseled patient to contact our office if he passes the stone in the interim.  He expressed understanding.  Additionally, I reviewed the risks of ureteroscopy including bleeding, infection, and damage to surrounding structures.  I explained that  his urine today is reassuring for infection, and we will send his urine for culture today to confirm.  Lastly, we reviewed common symptoms of stent discomfort including gross hematuria, flank pain, groin pain, dysuria, urgency, and frequency.  He expressed understanding and wishes to proceed. - Urinalysis, Complete - promethazine (PHENERGAN) injection 25 mg - ketorolac (TORADOL) injection 60 mg - CULTURE, URINE COMPREHENSIVE - tamsulosin (FLOMAX) 0.4 MG CAPS capsule; Take 1 capsule (0.4 mg total) by mouth daily after breakfast.  Dispense: 14 capsule; Refill: 1 - oxyCODONE-acetaminophen (PERCOCET/ROXICET) 5-325 MG tablet; Take 1-2 tablets by mouth every 6 (six) hours as needed for severe pain.  Dispense: 20 tablet; Refill: 0 - ondansetron (ZOFRAN ODT) 4 MG disintegrating tablet; Take 1 tablet (4 mg total) by mouth every 8 (eight) hours as needed for nausea or vomiting.  Dispense: 15 tablet; Refill: 1 - Ketorolac Tromethamine 15.75 MG/SPRAY SOLN; Place 2 sprays into the nose every 6 (six) hours as needed for up to 5 days.  Dispense: 1 each; Refill: 0   Return in about 5 days (around 09/10/2019) for Left URS/LL/stent with Dr. Stoioff.  Mehar Sagen, PA-C  [facility] [address], [address] [place], [place] [address] [phone]    

## 2019-09-06 ENCOUNTER — Ambulatory Visit: Payer: BC Managed Care – PPO | Admitting: Urology

## 2019-09-09 ENCOUNTER — Other Ambulatory Visit
Admission: RE | Admit: 2019-09-09 | Discharge: 2019-09-09 | Disposition: A | Discharge status: Home / Self Care | Payer: BC Managed Care – PPO | Source: Ambulatory Visit | Attending: Urology | Admitting: Urology

## 2019-09-09 ENCOUNTER — Encounter
Admission: RE | Admit: 2019-09-09 | Discharge: 2019-09-09 | Disposition: A | Discharge status: Home / Self Care | Payer: BC Managed Care – PPO | Source: Ambulatory Visit | Attending: Urology | Admitting: Urology

## 2019-09-09 ENCOUNTER — Other Ambulatory Visit: Payer: Self-pay

## 2019-09-09 ENCOUNTER — Encounter: Payer: Self-pay | Admitting: Urology

## 2019-09-09 DIAGNOSIS — Z01812 Encounter for preprocedural laboratory examination: Secondary | ICD-10-CM | POA: Insufficient documentation

## 2019-09-09 DIAGNOSIS — Z20822 Contact with and (suspected) exposure to covid-19: Secondary | ICD-10-CM | POA: Diagnosis not present

## 2019-09-09 LAB — URINALYSIS, COMPLETE
Bilirubin, UA: NEGATIVE
Glucose, UA: NEGATIVE
Ketones, UA: NEGATIVE
Leukocytes,UA: NEGATIVE
Nitrite, UA: NEGATIVE
Specific Gravity, UA: >1.03 — ABNORMAL HIGH (ref 1.005–1.030)
Urobilinogen, Ur: 0.2 mg/dL (ref 0.2–1.0)
pH, UA: 5.5 (ref 5.0–7.5)

## 2019-09-09 LAB — MICROSCOPIC EXAMINATION: Bacteria, UA: NONE SEEN

## 2019-09-09 LAB — CULTURE, URINE COMPREHENSIVE

## 2019-09-09 LAB — SARS CORONAVIRUS 2 (TAT 6-24 HRS): SARS Coronavirus 2: NEGATIVE

## 2019-09-09 NOTE — Patient Instructions (Signed)
Your procedure is scheduled on:  Report to DAY SURGERY DEPARTMENT LOCATED ON 2ND FLOOR MEDICAL MALL ENTRANCE. To find out your arrival time please call [phone] between 1PM - 3PM on .  Remember: Instructions that are not followed completely may result in serious medical risk, up to and including death, or upon the discretion of your surgeon and anesthesiologist your surgery may need to be rescheduled.     _X__ 1. Do not eat food after midnight the night before your procedure.                 No gum chewing or hard candies. You may drink clear liquids up to 2 hours                 before you are scheduled to arrive for your surgery- DO not drink clear                 liquids within 2 hours of the start of your surgery.                 Clear Liquids include:  water, apple juice without pulp, clear carbohydrate                 drink such as Clearfast or Gatorade, Black Coffee or Tea (Do not add                 anything to coffee or tea). Diabetics water only  __X__2.  On the morning of surgery brush your teeth with toothpaste and water, you                 may rinse your mouth with mouthwash if you wish.  Do not swallow any              toothpaste of mouthwash.     _X__ 3.  No Alcohol for 24 hours before or after surgery.   _X__ 4.  Do Not Smoke or use e-cigarettes For 24 Hours Prior to Your Surgery.                 Do not use any chewable tobacco products for at least 6 hours prior to                 surgery.  ____  5.  Bring all medications with you on the day of surgery if instructed.   __X__  6.  Notify your doctor if there is any change in your medical condition      (cold, fever, infections).     Do not wear jewelry, make-up, hairpins, clips or nail polish. Do not wear lotions, powders, or perfumes.  Do not shave 48 hours prior to surgery. Men may shave face and neck. Do not bring valuables to the hospital.    [facility] is not responsible for any belongings or  valuables.  Contacts, dentures/partials or body piercings may not be worn into surgery. Bring a case for your contacts, glasses or hearing aids, a denture cup will be supplied. Leave your suitcase in the car. After surgery it may be brought to your room. For patients admitted to the hospital, discharge time is determined by your treatment team.   Patients discharged the day of surgery will not be allowed to drive home.   Please read over the following fact sheets that you were given:   MRSA Information  __X__ Take these medicines the morning of surgery with A SIP OF WATER:      1.   2.   3.   4.  5.  6.  ____ Fleet Enema (as directed)   __X__ Use CHG Soap/SAGE wipes as directed  ____ Use inhalers on the day of surgery  ____ Stop metformin/Janumet/Farxiga 2 days prior to surgery    ____ Take 1/2 of usual insulin dose the night before surgery. No insulin the morning          of surgery.   ____ Stop Blood Thinners Coumadin/Plavix/Xarelto/Pleta/Pradaxa/Eliquis/Effient/Aspirin  on   Or contact your Surgeon, Cardiologist or Medical Doctor regarding  ability to stop your blood thinners  __X__ Stop Anti-inflammatories 7 days before surgery such as Advil, Ibuprofen, Motrin,  BC or Goodies Powder, Naprosyn, Naproxen, Aleve, Aspirin    __X__ Stop all herbal supplements, fish oil or vitamin E until after surgery.    ____ Bring C-Pap to the hospital.      

## 2019-09-10 ENCOUNTER — Other Ambulatory Visit: Payer: Self-pay

## 2019-09-10 ENCOUNTER — Encounter: Payer: Self-pay | Admitting: Urology

## 2019-09-10 ENCOUNTER — Ambulatory Visit: Payer: BC Managed Care – PPO | Admitting: Anesthesiology

## 2019-09-10 ENCOUNTER — Encounter
Admission: RE | Disposition: A | Discharge status: Home / Self Care | Payer: Self-pay | Source: Home / Self Care | Attending: Urology

## 2019-09-10 ENCOUNTER — Ambulatory Visit
Admission: RE | Admit: 2019-09-10 | Discharge: 2019-09-10 | Disposition: A | Discharge status: Home / Self Care | Payer: BC Managed Care – PPO | Attending: Urology | Admitting: Urology

## 2019-09-10 ENCOUNTER — Ambulatory Visit: Payer: BC Managed Care – PPO

## 2019-09-10 DIAGNOSIS — N132 Hydronephrosis with renal and ureteral calculous obstruction: Secondary | ICD-10-CM | POA: Diagnosis not present

## 2019-09-10 DIAGNOSIS — Z880 Allergy status to penicillin: Secondary | ICD-10-CM | POA: Insufficient documentation

## 2019-09-10 DIAGNOSIS — N201 Calculus of ureter: Secondary | ICD-10-CM | POA: Diagnosis present

## 2019-09-10 HISTORY — PX: CYSTOSCOPY W/ RETROGRADES: SHX1426

## 2019-09-10 HISTORY — PX: CYSTOSCOPY WITH STENT PLACEMENT: SHX5790

## 2019-09-10 HISTORY — DX: Personal history of urinary calculi: Z87.442

## 2019-09-10 HISTORY — PX: CYSTOSCOPY WITH URETHRAL DILATATION: SHX5125

## 2019-09-10 HISTORY — PX: URETEROSCOPY: SHX842

## 2019-09-10 SURGERY — CYSTOSCOPY, WITH URETHRAL DILATION
Anesthesia: General | Site: Ureter | Laterality: Left

## 2019-09-10 MED ORDER — OXYCODONE-ACETAMINOPHEN 5-325 MG PO TABS: 1.0000 | ORAL_TABLET | Freq: Four times a day (QID) | ORAL | 0 refills | Status: DC | PRN

## 2019-09-10 MED ORDER — OXYBUTYNIN CHLORIDE 5 MG PO TABS: ORAL_TABLET | ORAL | 0 refills | Status: DC

## 2019-09-10 MED ORDER — LIDOCAINE HCL (PF) 2 % IJ SOLN
INTRAMUSCULAR | Status: AC
  Filled 2019-09-10: qty 5

## 2019-09-10 MED ORDER — IOHEXOL 180 MG/ML  SOLN
INTRAMUSCULAR | Status: DC | PRN
  Administered 2019-09-10: 40 mL

## 2019-09-10 MED ORDER — ONDANSETRON HCL 4 MG/2ML IJ SOLN: 4.0000 mg | Freq: Once | INTRAMUSCULAR | Status: DC | PRN

## 2019-09-10 MED ORDER — PROPOFOL 10 MG/ML IV BOLUS
INTRAVENOUS | Status: DC | PRN
  Administered 2019-09-10: 200 mg via INTRAVENOUS

## 2019-09-10 MED ORDER — LACTATED RINGERS IV SOLN: INTRAVENOUS | Status: DC

## 2019-09-10 MED ORDER — FAMOTIDINE 20 MG PO TABS: 20.0000 mg | ORAL_TABLET | Freq: Once | ORAL | Status: AC

## 2019-09-10 MED ORDER — MIDAZOLAM HCL 2 MG/2ML IJ SOLN
INTRAMUSCULAR | Status: DC | PRN
  Administered 2019-09-10: 2 mg via INTRAVENOUS

## 2019-09-10 MED ORDER — CIPROFLOXACIN IN D5W 400 MG/200ML IV SOLN
400.0000 mg | INTRAVENOUS | Status: AC
  Administered 2019-09-10: 400 mg via INTRAVENOUS

## 2019-09-10 MED ORDER — ONDANSETRON HCL 4 MG/2ML IJ SOLN
INTRAMUSCULAR | Status: DC | PRN
  Administered 2019-09-10: 4 mg via INTRAVENOUS

## 2019-09-10 MED ORDER — CHLORHEXIDINE GLUCONATE 0.12 % MT SOLN: 15.0000 mL | Freq: Once | OROMUCOSAL | Status: AC

## 2019-09-10 MED ORDER — MIDAZOLAM HCL 2 MG/2ML IJ SOLN
INTRAMUSCULAR | Status: AC
  Filled 2019-09-10: qty 2

## 2019-09-10 MED ORDER — DEXMEDETOMIDINE HCL IN NACL 200 MCG/50ML IV SOLN
INTRAVENOUS | Status: DC | PRN
  Administered 2019-09-10: 20 ug via INTRAVENOUS

## 2019-09-10 MED ORDER — ONDANSETRON HCL 4 MG/2ML IJ SOLN
INTRAMUSCULAR | Status: AC
  Filled 2019-09-10: qty 2

## 2019-09-10 MED ORDER — FENTANYL CITRATE (PF) 100 MCG/2ML IJ SOLN
INTRAMUSCULAR | Status: DC | PRN
Start: 2019-09-10 — End: 2019-09-10
  Administered 2019-09-10 (×2): 50 ug via INTRAVENOUS

## 2019-09-10 MED ORDER — LIDOCAINE HCL (CARDIAC) PF 100 MG/5ML IV SOSY
PREFILLED_SYRINGE | INTRAVENOUS | Status: DC | PRN
  Administered 2019-09-10: 100 mg via INTRAVENOUS

## 2019-09-10 MED ORDER — FAMOTIDINE 20 MG PO TABS
ORAL_TABLET | ORAL | Status: AC
  Administered 2019-09-10: 20 mg via ORAL
  Filled 2019-09-10: qty 1

## 2019-09-10 MED ORDER — DEXAMETHASONE SODIUM PHOSPHATE 10 MG/ML IJ SOLN
INTRAMUSCULAR | Status: AC
  Filled 2019-09-10: qty 1

## 2019-09-10 MED ORDER — CIPROFLOXACIN IN D5W 400 MG/200ML IV SOLN
INTRAVENOUS | Status: AC
  Filled 2019-09-10: qty 200

## 2019-09-10 MED ORDER — PROPOFOL 10 MG/ML IV BOLUS
INTRAVENOUS | Status: AC
  Filled 2019-09-10: qty 20

## 2019-09-10 MED ORDER — ORAL CARE MOUTH RINSE: 15.0000 mL | Freq: Once | OROMUCOSAL | Status: AC

## 2019-09-10 MED ORDER — FENTANYL CITRATE (PF) 100 MCG/2ML IJ SOLN
INTRAMUSCULAR | Status: AC
  Filled 2019-09-10: qty 2

## 2019-09-10 MED ORDER — CHLORHEXIDINE GLUCONATE 0.12 % MT SOLN
OROMUCOSAL | Status: AC
  Administered 2019-09-10: 15 mL via OROMUCOSAL
  Filled 2019-09-10: qty 15

## 2019-09-10 MED ORDER — DEXAMETHASONE SODIUM PHOSPHATE 10 MG/ML IJ SOLN
INTRAMUSCULAR | Status: DC | PRN
  Administered 2019-09-10: 10 mg via INTRAVENOUS

## 2019-09-10 MED ORDER — ACETAMINOPHEN 10 MG/ML IV SOLN
INTRAVENOUS | Status: AC
  Filled 2019-09-10: qty 100

## 2019-09-10 MED ORDER — ACETAMINOPHEN 10 MG/ML IV SOLN
INTRAVENOUS | Status: DC | PRN
  Administered 2019-09-10: 1000 mg via INTRAVENOUS

## 2019-09-10 MED ORDER — FENTANYL CITRATE (PF) 100 MCG/2ML IJ SOLN: 25.0000 ug | INTRAMUSCULAR | Status: DC | PRN

## 2019-09-10 SURGICAL SUPPLY — 33 items
BAG DRAIN CYSTO-URO LG1000N (MISCELLANEOUS) ×4 IMPLANT
BASKET ZERO TIP 1.9FR (BASKET) IMPLANT
BRUSH SCRUB EZ 1% IODOPHOR (MISCELLANEOUS) ×4 IMPLANT
BSKT STON RTRVL ZERO TP 1.9FR (BASKET)
CATH URETL 5X70 OPEN END (CATHETERS) ×4 IMPLANT
CNTNR SPEC 2.5X3XGRAD LEK (MISCELLANEOUS)
CONT SPEC 4OZ STER OR WHT (MISCELLANEOUS)
CONT SPEC 4OZ STRL OR WHT (MISCELLANEOUS)
CONTAINER SPEC 2.5X3XGRAD LEK (MISCELLANEOUS) IMPLANT
DILATOR UROMAX ULTRA (MISCELLANEOUS) ×4 IMPLANT
DRAPE UTILITY 15X26 TOWEL STRL (DRAPES) ×4 IMPLANT
FIBER LASER TRACTIP 200 (UROLOGICAL SUPPLIES) IMPLANT
GLOVE BIOGEL PI IND STRL 7.5 (GLOVE) ×2 IMPLANT
GLOVE BIOGEL PI INDICATOR 7.5 (GLOVE) ×2
GOWN STRL REUS W/ TWL LRG LVL3 (GOWN DISPOSABLE) ×2 IMPLANT
GOWN STRL REUS W/ TWL XL LVL3 (GOWN DISPOSABLE) ×2 IMPLANT
GOWN STRL REUS W/TWL LRG LVL3 (GOWN DISPOSABLE) ×4
GOWN STRL REUS W/TWL XL LVL3 (GOWN DISPOSABLE) ×4
GUIDEWIRE STR DUAL SENSOR (WIRE) ×8 IMPLANT
INFUSOR MANOMETER BAG 3000ML (MISCELLANEOUS) ×4 IMPLANT
INTRODUCER DILATOR DOUBLE (INTRODUCER) IMPLANT
KIT TURNOVER CYSTO (KITS) ×4 IMPLANT
PACK CYSTO AR (MISCELLANEOUS) ×4 IMPLANT
SET CYSTO W/LG BORE CLAMP LF (SET/KITS/TRAYS/PACK) ×4 IMPLANT
SHEATH URETERAL 12FRX35CM (MISCELLANEOUS) ×4 IMPLANT
SOL .9 NS 3000ML IRR  AL (IV SOLUTION) ×2
SOL .9 NS 3000ML IRR AL (IV SOLUTION) ×2
SOL .9 NS 3000ML IRR UROMATIC (IV SOLUTION) ×2 IMPLANT
STENT URET 6FRX24 CONTOUR (STENTS) IMPLANT
STENT URET 6FRX26 CONTOUR (STENTS) ×4 IMPLANT
SURGILUBE 2OZ TUBE FLIPTOP (MISCELLANEOUS) ×4 IMPLANT
VALVE UROSEAL ADJ ENDO (VALVE) ×4 IMPLANT
WATER STERILE IRR 1000ML POUR (IV SOLUTION) ×4 IMPLANT

## 2019-09-10 NOTE — Interval H&P Note (Signed)
History and Physical Interval Note:  09/10/2019 9:22 AM  Glenn Serrano  has presented today for surgery, with the diagnosis of left ureteral stone.  The various methods of treatment have been discussed with the patient and family. After consideration of risks, benefits and other options for treatment, the patient has consented to  Procedure(s): CYSTOSCOPY/URETEROSCOPY/HOLMIUM LASER/STENT PLACEMENT (Left) CYSTOSCOPY WITH URETEROSCOPY, STONE BASKETRY AND STENT PLACEMENT (Left) as a surgical intervention.  The patient's history has been reviewed, patient examined, no change in status, stable for surgery.  I have reviewed the patient's chart and labs.  Questions were answered to the patient's satisfaction.     Marvel Sapp C Suhaan Perleberg

## 2019-09-10 NOTE — Transfer of Care (Signed)
Immediate Anesthesia Transfer of Care Note  Patient: Glenn Serrano  Procedure(s) Performed: CYSTOSCOPY WITH URETHRAL DILATATION (Left ) CYSTOSCOPY WITH RETROGRADE PYELOGRAM (Left Ureter) URETEROSCOPY (Left Ureter) CYSTOSCOPY WITH STENT PLACEMENT (Left Ureter)  Patient Location: PACU  Anesthesia Type:General  Level of Consciousness: sedated  Airway & Oxygen Therapy: Patient Spontanous Breathing and Patient connected to face mask oxygen  Post-op Assessment: Report given to RN and Post -op Vital signs reviewed and stable  Post vital signs: Reviewed and stable  Last Vitals:  Vitals Value Taken Time  BP 132/78 09/10/19 1054  Temp 36.4 C 09/10/19 1054  Pulse 63 09/10/19 1055  Resp 13 09/10/19 1055  SpO2 99 % 09/10/19 1055  Vitals shown include unvalidated device data.  Last Pain:  Vitals:   09/10/19 1054  TempSrc:   PainSc: Asleep         Complications: No complications documented.

## 2019-09-10 NOTE — Interval H&P Note (Signed)
History and Physical Interval Note: CV:RRR LUNGS:clear  09/10/2019 9:23 AM  Glenn Serrano  has presented today for surgery, with the diagnosis of left ureteral stone.  The various methods of treatment have been discussed with the patient and family. After consideration of risks, benefits and other options for treatment, the patient has consented to  Procedure(s): CYSTOSCOPY/URETEROSCOPY/HOLMIUM LASER/STENT PLACEMENT (Left) CYSTOSCOPY WITH URETEROSCOPY, STONE BASKETRY AND STENT PLACEMENT (Left) as a surgical intervention.  The patient's history has been reviewed, patient examined, no change in status, stable for surgery.  I have reviewed the patient's chart and labs.  Questions were answered to the patient's satisfaction.     Glenn Serrano

## 2019-09-10 NOTE — Anesthesia Preprocedure Evaluation (Signed)
Anesthesia Evaluation  Patient identified by MRN, date of birth, ID band Patient awake    Reviewed: Allergy & Precautions, NPO status , Patient's Chart, lab work & pertinent test results  History of Anesthesia Complications Negative for: history of anesthetic complications  Airway Mallampati: II       Dental   Pulmonary neg sleep apnea, neg COPD, Current Smoker ("vapes") and Patient abstained from smoking., former smoker,           Cardiovascular (-) hypertension(-) Past MI and (-) CHF (-) pacemaker(-) Valvular Problems/Murmurs     Neuro/Psych neg Seizures    GI/Hepatic Neg liver ROS, neg GERD  ,  Endo/Other  neg diabetes  Renal/GU negative Renal ROS     Musculoskeletal   Abdominal   Peds  Hematology   Anesthesia Other Findings   Reproductive/Obstetrics                             Anesthesia Physical Anesthesia Plan  ASA: II  Anesthesia Plan: General   Post-op Pain Management:    Induction: Intravenous  PONV Risk Score and Plan: 2 and Ondansetron, Dexamethasone and Treatment may vary due to age or medical condition  Airway Management Planned: Oral ETT  Additional Equipment:   Intra-op Plan:   Post-operative Plan:   Informed Consent: I have reviewed the patients History and Physical, chart, labs and discussed the procedure including the risks, benefits and alternatives for the proposed anesthesia with the patient or authorized representative who has indicated his/her understanding and acceptance.       Plan Discussed with:   Anesthesia Plan Comments:         Anesthesia Quick Evaluation

## 2019-09-10 NOTE — Anesthesia Postprocedure Evaluation (Signed)
Anesthesia Post Note  Patient: Glenn Serrano  Procedure(s) Performed: CYSTOSCOPY WITH URETHRAL DILATATION (Left ) CYSTOSCOPY WITH RETROGRADE PYELOGRAM (Left Ureter) URETEROSCOPY (Left Ureter) CYSTOSCOPY WITH STENT PLACEMENT (Left Ureter)  Patient location during evaluation: PACU Anesthesia Type: General Level of consciousness: awake and alert Pain management: pain level controlled Vital Signs Assessment: post-procedure vital signs reviewed and stable Respiratory status: spontaneous breathing and respiratory function stable Cardiovascular status: stable Anesthetic complications: no   No complications documented.   Last Vitals:  Vitals:   09/10/19 1139 09/10/19 1205  BP: 127/82 120/65  Pulse: (!) 56 98  Resp: 15 16  Temp:  36.6 C  SpO2: 98% (!) 62%    Last Pain:  Vitals:   09/10/19 1205  TempSrc: Temporal  PainSc: 0-No pain                 Nabeeha Badertscher K

## 2019-09-10 NOTE — Anesthesia Procedure Notes (Signed)
Procedure Name: LMA Insertion Date/Time: 09/10/2019 9:42 AM Performed by: Ginger Carne, CRNA Pre-anesthesia Checklist: Patient identified, Emergency Drugs available, Suction available, Patient being monitored and Timeout performed Patient Re-evaluated:Patient Re-evaluated prior to induction Oxygen Delivery Method: Circle system utilized Preoxygenation: Pre-oxygenation with 100% oxygen Induction Type: IV induction LMA: LMA inserted LMA Size: 5.0 Tube type: Oral Number of attempts: 1 Placement Confirmation: positive ETCO2 and breath sounds checked- equal and bilateral Tube secured with: Tape Dental Injury: Teeth and Oropharynx as per pre-operative assessment

## 2019-09-10 NOTE — Discharge Instructions (Signed)
AMBULATORY SURGERY  DISCHARGE INSTRUCTIONS   1) The drugs that you were given will stay in your system until tomorrow so for the next 24 hours you should not:  A) Drive an automobile B) Make any legal decisions C) Drink any alcoholic beverage   2) You may resume regular meals tomorrow.  Today it is better to start with liquids and gradually work up to solid foods.  You may eat anything you prefer, but it is better to start with liquids, then soup and crackers, and gradually work up to solid foods.   3) Please notify your doctor immediately if you have any unusual bleeding, trouble breathing, redness and pain at the surgery site, drainage, fever, or pain not relieved by medication.    4) Additional Instructions:        Please contact your physician with any problems or Same Day Surgery at [phone], Monday through Friday 6 am to 4 pm, or [place] at [facility] number at [phone].DISCHARGE INSTRUCTIONS FOR KIDNEY STONE/URETERAL STENT   MEDICATIONS:  1. Resume all your other meds from home.  2.  AZO (over-the-counter) can help with the burning/stinging when you urinate. 3.  Oxycodone is for moderate/severe pain, Rx was sent to your pharmacy. 4.  Rx oxybutynin was sent to pharmacy for bladder irritation  ACTIVITY:  1. May resume regular activities in 24 hours. 2. No driving while on narcotic pain medications  3. Drink plenty of water  4. Continue to walk at home - you can still get blood clots when you are at home, so keep active, but don't over do it.  5. May return to work/school tomorrow or when you feel ready   BATHING:  1. You can shower.  SIGNS/SYMPTOMS TO CALL:  Please call us if you have a fever greater than 101.5, uncontrolled nausea/vomiting, uncontrolled pain, dizziness, unable to urinate, excessively bloody urine, chest pain, shortness of breath, leg swelling, leg pain, or any other concerns or questions.   Common postop symptoms include  frequency, urgency, bladder spasm and blood in the urine  You can reach [place] at [phone].   FOLLOW-UP:  1. You will be contacted regarding follow-up

## 2019-09-11 ENCOUNTER — Encounter: Payer: Self-pay | Admitting: Urology

## 2019-09-12 ENCOUNTER — Other Ambulatory Visit: Payer: Self-pay | Admitting: Radiology

## 2019-09-12 ENCOUNTER — Telehealth: Payer: Self-pay | Admitting: Radiology

## 2019-09-12 DIAGNOSIS — N201 Calculus of ureter: Secondary | ICD-10-CM

## 2019-09-12 NOTE — Telephone Encounter (Signed)
Notified patient had a small ureter and Dr Lonna Cobb was unable to get the ureteroscope up to the stone. Needs a KUB on Monday to see if it can be seen alongside the stent. If it can lithotripsy if it cannot be seen we will have to reschedule ureteroscopy in 2 weeks. Patient requests refill of pain medication to last through the weekend.

## 2019-09-12 NOTE — Op Note (Signed)
Preoperative diagnosis: Left ureteral calculus  Postoperative diagnosis: Left ureteral calculus  Procedure:  1. Cystoscopy 2. Left ureteroscopy  3. Left ureteral stent placement (6FR 26 cm)  4. Left retrograde pyelography with interpretation  Surgeon: Nicki Reaper C. Volanda Mangine, M.D.  Anesthesia: General  Complications: None  Intraoperative findings:  1.  Left retrograde pyelography demonstrated a filling defect at the UPJ with mild hydronephrosis consistent with the known left proximal ureteral calculus mild contrast extravasation noted distal ureter  2.  Unable to access mid ureter proximally with the flexible ureteroscope secondary to anatomy  EBL: Minimal  Specimens: 1. None   Indication: Glenn Serrano is a 39 y.o. with left renal colic secondary to a 3 x 5 mm left proximal ureteral calculus.  The calculus was not visualized on KUB.  After reviewing the management options for treatment, the patient elected to proceed with the above surgical procedure(s). We have discussed the potential benefits and risks of the procedure, side effects of the proposed treatment, the likelihood of the patient achieving the goals of the procedure, and any potential problems that might occur during the procedure or recuperation. Informed consent has been obtained.  Description of procedure:  The patient was taken to the operating room and general anesthesia was induced.  The patient was placed in the dorsal lithotomy position, prepped and draped in the usual sterile fashion, and preoperative antibiotics were administered. A preoperative time-out was performed.   A 21 French cystoscope was lubricated and passed under direct vision.  The urethra was normal in caliber without stricture.  The prostate demonstrated no significant lateral lobe enlargement and mild bladder neck elevation.  Panendoscopy was performed and the bladder mucosa showed no erythema, solid or papillary lesions.  Attention was  directed to the left ureteral orifice and a 0.038 Sensor wire was then advanced up the ureter into the renal pelvis under fluoroscopic guidance.  A 4.5 Fr semirigid ureteroscope was then advanced into the ureter next to the guidewire and advanced proximally to the mid ureter however could not safely be advanced any further.  A second Sensor wire was placed and the ureteroscope was removed.  A single channel digital flexible ureteroscope was advanced over the second wire however would not engage the left ureteral orifice.  The ureteroscope was removed and a 15 French 4 cm UroMax dilating balloon was placed over the guidewire with the distal radiopaque marker just outside the ureteral orifice and the balloon was inflated to 10 atm and after 1 minute the balloon was uniform and fully inflated.  The balloon was deflated and removed.  The digital ureteroscope was then advanced over the second wire and easily passed into the distal ureter however a second narrowed area in the midportion of the mid ureter was encountered and could not be negotiated with the ureteroscope.  At this point it was elected to place a ureteral stent.  A 5FR open-ended ureteral catheter was placed in the second guidewire was removed.  Retrograde pyelogram was performed with findings as described above The guidewire was removed.  A 6FR/26 cm Contour ureteral stent was then advanced over the wire with good positioning noted in the renal pelvis under fluoroscopy and bladder under direct vision.  The bladder was emptied and cystoscope was removed.  The patient appeared to tolerate the procedure well and without complications.  After anesthetic reversal the patient was transported to the PACU in stable condition.   Plan: A KUB early next week to see if the calculus is  visualized alongside the stent and if so ESWL will be performed.  If the calculus is not visualized we will plan follow-up ureteroscopy in 2 weeks  Glenn Giovanni,  MD

## 2019-09-13 MED ORDER — OXYCODONE-ACETAMINOPHEN 5-325 MG PO TABS
1.0000 | ORAL_TABLET | Freq: Four times a day (QID) | ORAL | 0 refills | Status: DC | PRN
Start: 2019-09-13 — End: 2019-09-19

## 2019-09-13 NOTE — Telephone Encounter (Signed)
LMOM that script was sent to pharmacy. 

## 2019-09-16 ENCOUNTER — Telehealth: Payer: Self-pay | Admitting: Urology

## 2019-09-16 ENCOUNTER — Ambulatory Visit
Admission: RE | Admit: 2019-09-16 | Discharge: 2019-09-16 | Disposition: A | Discharge status: Home / Self Care | Payer: BC Managed Care – PPO | Attending: Urology | Admitting: Urology

## 2019-09-16 ENCOUNTER — Other Ambulatory Visit: Payer: Self-pay

## 2019-09-16 ENCOUNTER — Ambulatory Visit
Admission: RE | Admit: 2019-09-16 | Discharge: 2019-09-16 | Disposition: A | Discharge status: Home / Self Care | Payer: BC Managed Care – PPO | Source: Ambulatory Visit | Attending: Urology | Admitting: Urology

## 2019-09-16 DIAGNOSIS — N201 Calculus of ureter: Secondary | ICD-10-CM

## 2019-09-16 NOTE — Telephone Encounter (Signed)
pt wanted to let us know that he did his xray earlier today and is eager to know what the next steps are as he "has been dealing with this thing for 4 weeks now" please advise. Thank you.

## 2019-09-17 ENCOUNTER — Other Ambulatory Visit
Admission: RE | Admit: 2019-09-17 | Discharge: 2019-09-17 | Disposition: A | Discharge status: Home / Self Care | Payer: BC Managed Care – PPO | Attending: Urology | Admitting: Urology

## 2019-09-17 ENCOUNTER — Other Ambulatory Visit: Payer: Self-pay | Admitting: Radiology

## 2019-09-17 DIAGNOSIS — N201 Calculus of ureter: Secondary | ICD-10-CM | POA: Diagnosis not present

## 2019-09-17 NOTE — Telephone Encounter (Signed)
[name] is not seen on KUB.  Schedule follow-up ureteroscopy 1-2 weeks

## 2019-09-17 NOTE — Addendum Note (Signed)
Addended by: Sueanne Margarita on: 09/17/2019 03:16 PM   Modules accepted: Orders

## 2019-09-18 LAB — URINE CULTURE: Culture: NO GROWTH

## 2019-09-19 ENCOUNTER — Telehealth: Payer: Self-pay | Admitting: Urology

## 2019-09-19 DIAGNOSIS — N201 Calculus of ureter: Secondary | ICD-10-CM

## 2019-09-19 MED ORDER — KETOROLAC TROMETHAMINE 10 MG PO TABS: 10.0000 mg | ORAL_TABLET | Freq: Four times a day (QID) | ORAL | 0 refills | Status: DC | PRN

## 2019-09-19 MED ORDER — OXYCODONE-ACETAMINOPHEN 5-325 MG PO TABS
1.0000 | ORAL_TABLET | Freq: Four times a day (QID) | ORAL | 0 refills | Status: DC | PRN
Start: 2019-09-19 — End: 2019-10-23

## 2019-09-19 NOTE — Telephone Encounter (Signed)
Pt asks for more pain medication to be sent to [facility] in [place].  Please advise.

## 2019-09-19 NOTE — Telephone Encounter (Signed)
Rx refilled and I also send in Rx ketorolac for him to also take to try and reduce the amount of narcotic medication he is requiring

## 2019-09-19 NOTE — Telephone Encounter (Signed)
Notified patient as instructed. Left message on vm

## 2019-09-20 ENCOUNTER — Other Ambulatory Visit
Admission: RE | Admit: 2019-09-20 | Discharge: 2019-09-20 | Disposition: A | Discharge status: Home / Self Care | Payer: BC Managed Care – PPO | Source: Ambulatory Visit | Attending: Urology | Admitting: Urology

## 2019-09-20 ENCOUNTER — Other Ambulatory Visit: Payer: Self-pay

## 2019-09-20 DIAGNOSIS — Z01812 Encounter for preprocedural laboratory examination: Secondary | ICD-10-CM | POA: Insufficient documentation

## 2019-09-20 DIAGNOSIS — Z20822 Contact with and (suspected) exposure to covid-19: Secondary | ICD-10-CM | POA: Diagnosis not present

## 2019-09-20 LAB — SARS CORONAVIRUS 2 (TAT 6-24 HRS): SARS Coronavirus 2: NEGATIVE

## 2019-09-23 MED ORDER — CIPROFLOXACIN IN D5W 400 MG/200ML IV SOLN
400.0000 mg | INTRAVENOUS | Status: AC
  Administered 2019-09-24: 400 mg via INTRAVENOUS

## 2019-09-24 ENCOUNTER — Ambulatory Visit: Payer: BC Managed Care – PPO | Admitting: Certified Registered"

## 2019-09-24 ENCOUNTER — Ambulatory Visit: Payer: BC Managed Care – PPO

## 2019-09-24 ENCOUNTER — Encounter: Payer: Self-pay | Admitting: Urology

## 2019-09-24 ENCOUNTER — Other Ambulatory Visit: Payer: Self-pay

## 2019-09-24 ENCOUNTER — Encounter
Admission: RE | Disposition: A | Discharge status: Home / Self Care | Payer: Self-pay | Source: Home / Self Care | Attending: Urology

## 2019-09-24 ENCOUNTER — Ambulatory Visit
Admission: RE | Admit: 2019-09-24 | Discharge: 2019-09-24 | Disposition: A | Discharge status: Home / Self Care | Payer: BC Managed Care – PPO | Attending: Urology | Admitting: Urology

## 2019-09-24 DIAGNOSIS — Z6834 Body mass index (BMI) 34.0-34.9, adult: Secondary | ICD-10-CM | POA: Insufficient documentation

## 2019-09-24 DIAGNOSIS — Z87891 Personal history of nicotine dependence: Secondary | ICD-10-CM | POA: Insufficient documentation

## 2019-09-24 DIAGNOSIS — Z880 Allergy status to penicillin: Secondary | ICD-10-CM | POA: Insufficient documentation

## 2019-09-24 DIAGNOSIS — G43909 Migraine, unspecified, not intractable, without status migrainosus: Secondary | ICD-10-CM | POA: Diagnosis not present

## 2019-09-24 DIAGNOSIS — Z8042 Family history of malignant neoplasm of prostate: Secondary | ICD-10-CM | POA: Insufficient documentation

## 2019-09-24 DIAGNOSIS — Z87898 Personal history of other specified conditions: Secondary | ICD-10-CM | POA: Insufficient documentation

## 2019-09-24 DIAGNOSIS — N201 Calculus of ureter: Secondary | ICD-10-CM

## 2019-09-24 DIAGNOSIS — Z881 Allergy status to other antibiotic agents status: Secondary | ICD-10-CM | POA: Insufficient documentation

## 2019-09-24 DIAGNOSIS — Z87442 Personal history of urinary calculi: Secondary | ICD-10-CM | POA: Diagnosis not present

## 2019-09-24 DIAGNOSIS — Z841 Family history of disorders of kidney and ureter: Secondary | ICD-10-CM | POA: Diagnosis not present

## 2019-09-24 DIAGNOSIS — E669 Obesity, unspecified: Secondary | ICD-10-CM | POA: Insufficient documentation

## 2019-09-24 HISTORY — PX: CYSTOSCOPY W/ RETROGRADES: SHX1426

## 2019-09-24 HISTORY — PX: CYSTOSCOPY/URETEROSCOPY/HOLMIUM LASER/STENT PLACEMENT: SHX6546

## 2019-09-24 SURGERY — CYSTOSCOPY/URETEROSCOPY/HOLMIUM LASER/STENT PLACEMENT
Anesthesia: General | Laterality: Left

## 2019-09-24 MED ORDER — PROPOFOL 10 MG/ML IV BOLUS
INTRAVENOUS | Status: AC
  Filled 2019-09-24: qty 20

## 2019-09-24 MED ORDER — FENTANYL CITRATE (PF) 100 MCG/2ML IJ SOLN
INTRAMUSCULAR | Status: DC | PRN
Start: 2019-09-24 — End: 2019-09-24
  Administered 2019-09-24 (×2): 50 ug via INTRAVENOUS

## 2019-09-24 MED ORDER — ORAL CARE MOUTH RINSE: 15.0000 mL | Freq: Once | OROMUCOSAL | Status: AC

## 2019-09-24 MED ORDER — FAMOTIDINE 20 MG PO TABS
ORAL_TABLET | ORAL | Status: AC
  Administered 2019-09-24: 20 mg via ORAL
  Filled 2019-09-24: qty 1

## 2019-09-24 MED ORDER — FENTANYL CITRATE (PF) 100 MCG/2ML IJ SOLN
INTRAMUSCULAR | Status: AC
  Filled 2019-09-24: qty 2

## 2019-09-24 MED ORDER — PROMETHAZINE HCL 25 MG/ML IJ SOLN: 6.2500 mg | Freq: Once | INTRAMUSCULAR | Status: AC

## 2019-09-24 MED ORDER — KETOROLAC TROMETHAMINE 30 MG/ML IJ SOLN
30.0000 mg | Freq: Once | INTRAMUSCULAR | Status: AC
  Administered 2019-09-24: 30 mg via INTRAVENOUS

## 2019-09-24 MED ORDER — OXYCODONE-ACETAMINOPHEN 5-325 MG PO TABS
ORAL_TABLET | ORAL | Status: AC
  Filled 2019-09-24: qty 1

## 2019-09-24 MED ORDER — ONDANSETRON HCL 4 MG/2ML IJ SOLN
INTRAMUSCULAR | Status: AC
  Filled 2019-09-24: qty 2

## 2019-09-24 MED ORDER — CHLORHEXIDINE GLUCONATE 0.12 % MT SOLN
15.0000 mL | Freq: Once | OROMUCOSAL | Status: AC
  Administered 2019-09-24: 15 mL via OROMUCOSAL

## 2019-09-24 MED ORDER — ONDANSETRON HCL 4 MG/2ML IJ SOLN
4.0000 mg | Freq: Once | INTRAMUSCULAR | Status: AC | PRN
  Administered 2019-09-24: 4 mg via INTRAVENOUS

## 2019-09-24 MED ORDER — MIDAZOLAM HCL 2 MG/2ML IJ SOLN
INTRAMUSCULAR | Status: AC
  Filled 2019-09-24: qty 2

## 2019-09-24 MED ORDER — PROPOFOL 10 MG/ML IV BOLUS
INTRAVENOUS | Status: DC | PRN
  Administered 2019-09-24: 170 mg via INTRAVENOUS
  Administered 2019-09-24: 30 mg via INTRAVENOUS

## 2019-09-24 MED ORDER — FENTANYL CITRATE (PF) 100 MCG/2ML IJ SOLN
25.0000 ug | INTRAMUSCULAR | Status: DC | PRN
  Administered 2019-09-24: 50 ug via INTRAVENOUS
  Administered 2019-09-24: 25 ug via INTRAVENOUS

## 2019-09-24 MED ORDER — FAMOTIDINE 20 MG PO TABS: 20.0000 mg | ORAL_TABLET | Freq: Once | ORAL | Status: AC

## 2019-09-24 MED ORDER — PROMETHAZINE HCL 25 MG/ML IJ SOLN
INTRAMUSCULAR | Status: AC
  Administered 2019-09-24: 6.25 mg via INTRAVENOUS
  Filled 2019-09-24: qty 1

## 2019-09-24 MED ORDER — OXYCODONE-ACETAMINOPHEN 5-325 MG PO TABS
1.0000 | ORAL_TABLET | Freq: Once | ORAL | Status: AC
  Administered 2019-09-24: 1 via ORAL

## 2019-09-24 MED ORDER — IOPAMIDOL (ISOVUE-200) INJECTION 41%
INTRAVENOUS | Status: DC | PRN
  Administered 2019-09-24: 10 mL

## 2019-09-24 MED ORDER — LACTATED RINGERS IV SOLN: INTRAVENOUS | Status: DC

## 2019-09-24 MED ORDER — ONDANSETRON HCL 4 MG/2ML IJ SOLN
INTRAMUSCULAR | Status: DC | PRN
  Administered 2019-09-24: 4 mg via INTRAVENOUS

## 2019-09-24 MED ORDER — FENTANYL CITRATE (PF) 100 MCG/2ML IJ SOLN
INTRAMUSCULAR | Status: AC
  Administered 2019-09-24: 50 ug via INTRAVENOUS
  Filled 2019-09-24: qty 2

## 2019-09-24 MED ORDER — CIPROFLOXACIN IN D5W 400 MG/200ML IV SOLN
INTRAVENOUS | Status: AC
  Filled 2019-09-24: qty 200

## 2019-09-24 MED ORDER — FENTANYL CITRATE (PF) 100 MCG/2ML IJ SOLN
INTRAMUSCULAR | Status: AC
  Administered 2019-09-24: 25 ug via INTRAVENOUS
  Filled 2019-09-24: qty 2

## 2019-09-24 MED ORDER — LIDOCAINE HCL (CARDIAC) PF 100 MG/5ML IV SOSY
PREFILLED_SYRINGE | INTRAVENOUS | Status: DC | PRN
  Administered 2019-09-24: 100 mg via INTRAVENOUS

## 2019-09-24 MED ORDER — MIDAZOLAM HCL 2 MG/2ML IJ SOLN
INTRAMUSCULAR | Status: DC | PRN
  Administered 2019-09-24: 2 mg via INTRAVENOUS

## 2019-09-24 MED ORDER — KETOROLAC TROMETHAMINE 30 MG/ML IJ SOLN
INTRAMUSCULAR | Status: AC
  Filled 2019-09-24: qty 1

## 2019-09-24 SURGICAL SUPPLY — 33 items
BAG DRAIN CYSTO-URO LG1000N (MISCELLANEOUS) ×3 IMPLANT
BASKET ZERO TIP 1.9FR (BASKET) ×3 IMPLANT
BRUSH SCRUB EZ 1% IODOPHOR (MISCELLANEOUS) ×3 IMPLANT
BSKT STON RTRVL ZERO TP 1.9FR (BASKET) ×1
CATH URETL 5X70 OPEN END (CATHETERS) ×3 IMPLANT
CNTNR SPEC 2.5X3XGRAD LEK (MISCELLANEOUS) ×1
CONT SPEC 4OZ STER OR WHT (MISCELLANEOUS) ×2
CONT SPEC 4OZ STRL OR WHT (MISCELLANEOUS) ×1
CONTAINER SPEC 2.5X3XGRAD LEK (MISCELLANEOUS) ×1 IMPLANT
DRAPE UTILITY 15X26 TOWEL STRL (DRAPES) ×3 IMPLANT
DRSG TEGADERM 2-3/8X2-3/4 SM (GAUZE/BANDAGES/DRESSINGS) ×3 IMPLANT
FIBER LASER TRACTIP 200 (UROLOGICAL SUPPLIES) ×3 IMPLANT
GLOVE BIOGEL PI IND STRL 7.5 (GLOVE) ×1 IMPLANT
GLOVE BIOGEL PI INDICATOR 7.5 (GLOVE) ×2
GOWN STRL REUS W/ TWL LRG LVL3 (GOWN DISPOSABLE) ×1 IMPLANT
GOWN STRL REUS W/ TWL XL LVL3 (GOWN DISPOSABLE) ×1 IMPLANT
GOWN STRL REUS W/TWL LRG LVL3 (GOWN DISPOSABLE) ×3
GOWN STRL REUS W/TWL XL LVL3 (GOWN DISPOSABLE) ×3
GUIDEWIRE STR DUAL SENSOR (WIRE) ×3 IMPLANT
INFUSOR MANOMETER BAG 3000ML (MISCELLANEOUS) ×3 IMPLANT
INTRODUCER DILATOR DOUBLE (INTRODUCER) IMPLANT
KIT TURNOVER CYSTO (KITS) ×3 IMPLANT
PACK CYSTO AR (MISCELLANEOUS) ×3 IMPLANT
SET CYSTO W/LG BORE CLAMP LF (SET/KITS/TRAYS/PACK) ×3 IMPLANT
SHEATH URETERAL 12FRX35CM (MISCELLANEOUS) IMPLANT
SOL .9 NS 3000ML IRR  AL (IV SOLUTION) ×2
SOL .9 NS 3000ML IRR AL (IV SOLUTION) ×1
SOL .9 NS 3000ML IRR UROMATIC (IV SOLUTION) ×1 IMPLANT
STENT URET 6FRX24 CONTOUR (STENTS) IMPLANT
STENT URET 6FRX26 CONTOUR (STENTS) ×3 IMPLANT
SURGILUBE 2OZ TUBE FLIPTOP (MISCELLANEOUS) ×3 IMPLANT
VALVE UROSEAL ADJ ENDO (VALVE) ×3 IMPLANT
WATER STERILE IRR 1000ML POUR (IV SOLUTION) ×3 IMPLANT

## 2019-09-24 NOTE — Anesthesia Preprocedure Evaluation (Signed)
Anesthesia Evaluation  Patient identified by MRN, date of birth, ID band Patient awake    Reviewed: Allergy & Precautions, NPO status , Patient's Chart, lab work & pertinent test results  History of Anesthesia Complications Negative for: history of anesthetic complications  Airway Mallampati: III  TM Distance: >3 FB Neck ROM: Full    Dental  (+) Poor Dentition   Pulmonary neg sleep apnea, neg COPD, Current Smoker and Patient abstained from smoking.,    breath sounds clear to auscultation- rhonchi (-) wheezing      Cardiovascular Exercise Tolerance: Good (-) hypertension(-) CAD, (-) Past MI, (-) Cardiac Stents and (-) CABG  Rhythm:Regular Rate:Normal - Systolic murmurs and - Diastolic murmurs    Neuro/Psych  Headaches, neg Seizures negative psych ROS   GI/Hepatic negative GI ROS, Neg liver ROS,   Endo/Other  negative endocrine ROSneg diabetes  Renal/GU Renal disease: nephrolithiasis.     Musculoskeletal negative musculoskeletal ROS (+)   Abdominal (+) + obese,   Peds  Hematology negative hematology ROS (+)   Anesthesia Other Findings Past Medical History: No date: Erectile dysfunction No date: Hematuria No date: History of kidney stones No date: Migraine headache     Comment:  none in last 2 yrs   Reproductive/Obstetrics                             Anesthesia Physical Anesthesia Plan  ASA: II  Anesthesia Plan: General   Post-op Pain Management:    Induction: Intravenous  PONV Risk Score and Plan: 1 and Ondansetron and Midazolam  Airway Management Planned: LMA  Additional Equipment:   Intra-op Plan:   Post-operative Plan:   Informed Consent: I have reviewed the patients History and Physical, chart, labs and discussed the procedure including the risks, benefits and alternatives for the proposed anesthesia with the patient or authorized representative who has indicated  his/her understanding and acceptance.     Dental advisory given  Plan Discussed with: CRNA and Anesthesiologist  Anesthesia Plan Comments:         Anesthesia Quick Evaluation

## 2019-09-24 NOTE — Op Note (Signed)
Preoperative diagnosis:  Left proximal ureteral calculus  Postoperative diagnosis:  Same  Procedure:  1. Cystoscopy 2. Left ureteroscopy and stone removal 3. Ureteroscopic laser lithotripsy 4. Left ureteral stent placement 6FR/26 cm 5. Left retrograde pyelography with interpretation  Surgeon: Lorin Picket C. Soniya Ashraf, M.D.  Anesthesia: General  Complications: None  Intraoperative findings:  1.  Cystoscopy: Meatus slightly narrow otherwise urethra normal in caliber without stricture; prostate nonocclusive with mild bladder neck elevation; bladder mucosa with mild inflammatory change left hemitrigone secondary to the indwelling stent; no solid or papillary lesions  2.  Ureteroscopy: Calculus in the left proximal ureter embedded into the mucosa medially and posteriorly.  Size larger than 3 x 5 mm in estimated 6-8 mm  3.  Left retrograde pyelography post procedure showed no filling defects, stone fragments or contrast extravasation  EBL: Minimal  Specimens: 1. Calculus fragments for analysis   Indication: Glenn Serrano is a 39 y.o. with a left proximal ureteral calculus who underwent left ureteroscopy on 8/24 however the upper ureter could not be accessed secondary to narrowing at the mid ureter.  An indwelling stent was placed for passive dilation.  The calculus was not visualized on plain x-ray. After reviewing the management options for treatment, the patient elected to proceed with the above surgical procedure(s). We have discussed the potential benefits and risks of the procedure, side effects of the proposed treatment, the likelihood of the patient achieving the goals of the procedure, and any potential problems that might occur during the procedure or recuperation. Informed consent has been obtained.  Description of procedure:  The patient was taken to the operating room and general anesthesia was induced.  The patient was placed in the dorsal lithotomy position, prepped and  draped in the usual sterile fashion, and preoperative antibiotics were administered. A preoperative time-out was performed.   A 21 French cystoscope was lubricated and passed under direct vision.  The meatus would not accept the 2 [place] scope however with the obturator in place and lubricated the sheath passed without difficulty.  The cystoscope was advanced proximally with findings as described above.    Attention was directed to the left ureteral orifice and a 0.038 Sensor wire was then advanced up the ureter alongside the indwelling stent.  The cystoscope was repassed and the stent was grasped with endoscopic forceps and then removed without difficulty.    A single channel digital flexible ureteroscope was passed per urethra and into the left ureteral orifice without difficulty.  The ureteroscope was easily advanced to the proximal ureter where the calculus was identified as described above.  No other findings in the ureter were noted.    A 200 micron holmium laser fiber was placed through the ureteroscope and the stone was dusted at a setting of 0.2 J and frequency of 20 hz.   The calculus broke into 2 fragments which were both dusted down to fragments approximately 3 mm in size and these were removed with a 1.9 [place] nitinol basket and sent for analysis.  The ureteroscope was repassed and the ureter was clear of fragments.  The scope was advanced into the renal pelvis.  A few small fragments estimated at 2 mm in size were identified and a lower pole calyx.  Retrograde pyelogram was performed with findings as described above.  All calyces were sequentially examined and no significantly sized fragments were identified.  A 6 FR/26 CM Contour ureteral stent was was placed under fluoroscopic guidance.  The wire was then removed with  an adequate stent curl noted in the renal pelvis as well as in the bladder.  The bladder was then emptied and the procedure ended. The patient appeared to tolerate  the procedure well and without complications.  After anesthetic reversal the patient was transported to the PACU in stable condition.   Plan: The stent was left attached to a tether and the patient was instructed to remove on Thursday, 09/26/2019. Postop follow-up will be scheduled in approximately 1 month   [name], MD

## 2019-09-24 NOTE — Progress Notes (Signed)
Pt having nausea. Pt given Zofran and that eased that nausea but then nausea returned. Dr. Priscella Mann notified. Acknowledged. Orders received.

## 2019-09-24 NOTE — Interval H&P Note (Signed)
History and Physical Interval Note:  Status post left ureteroscopy 8/24 however due to ureteral anatomy the scope was unable to be advanced beyond the mid ureter.  A ureteral stent was placed and he presents today for follow-up ureteroscopy.  All questions were answered and he desires to proceed.  09/24/2019 10:21 AM  Jill Alexanders Lurlean Leyden  has presented today for surgery, with the diagnosis of left ureteral stone.  The various methods of treatment have been discussed with the patient and family. After consideration of risks, benefits and other options for treatment, the patient has consented to  Procedure(s): CYSTOSCOPY/URETEROSCOPY/HOLMIUM LASER/STENT Exchange (Left) as a surgical intervention.  The patient's history has been reviewed, patient examined, no change in status, stable for surgery.  I have reviewed the patient's chart and labs.  Questions were answered to the patient's satisfaction.     Chezney Huether C Blythe Veach

## 2019-09-24 NOTE — Transfer of Care (Signed)
Immediate Anesthesia Transfer of Care Note  Patient: Glenn Serrano  Procedure(s) Performed: CYSTOSCOPY/URETEROSCOPY/HOLMIUM LASER/STENT Exchange (Left ) CYSTOSCOPY WITH RETROGRADE PYELOGRAM (Left )  Patient Location: PACU  Anesthesia Type:General  Level of Consciousness: awake, alert  and oriented  Airway & Oxygen Therapy: Patient Spontanous Breathing and Patient connected to face mask oxygen  Post-op Assessment: Report given to RN and Post -op Vital signs reviewed and stable  Post vital signs: Reviewed and stable  Last Vitals:  Vitals Value Taken Time  BP    Temp    Pulse 83 09/24/19 1151  Resp 21 09/24/19 1151  SpO2 98 % 09/24/19 1151  Vitals shown include unvalidated device data.  Last Pain:  Vitals:   09/24/19 0851  TempSrc: Tympanic  PainSc: 1          Complications: No complications documented.

## 2019-09-24 NOTE — Anesthesia Postprocedure Evaluation (Signed)
Anesthesia Post Note  Patient: Glenn Serrano  Procedure(s) Performed: CYSTOSCOPY/URETEROSCOPY/HOLMIUM LASER/STENT Exchange (Left ) CYSTOSCOPY WITH RETROGRADE PYELOGRAM (Left )  Patient location during evaluation: PACU Anesthesia Type: General Level of consciousness: awake and alert and oriented Pain management: pain level controlled Vital Signs Assessment: post-procedure vital signs reviewed and stable Respiratory status: spontaneous breathing, nonlabored ventilation and respiratory function stable Cardiovascular status: blood pressure returned to baseline and stable Postop Assessment: no signs of nausea or vomiting Anesthetic complications: no   No complications documented.   Last Vitals:  Vitals:   09/24/19 1236 09/24/19 1238  BP: 129/87 129/87  Pulse: 81 83  Resp: 15 15  Temp: 37.2 C   SpO2: 96% 94%    Last Pain:  Vitals:   09/24/19 1238  TempSrc:   PainSc: 3                  Kweku Stankey

## 2019-09-24 NOTE — Anesthesia Procedure Notes (Signed)
Procedure Name: LMA Insertion Date/Time: 09/24/2019 10:50 AM Performed by: Iran Planas, CRNA Pre-anesthesia Checklist: Patient identified, Emergency Drugs available, Suction available, Timeout performed and Patient being monitored Patient Re-evaluated:Patient Re-evaluated prior to induction Oxygen Delivery Method: Circle system utilized Preoxygenation: Pre-oxygenation with 100% oxygen Induction Type: IV induction Ventilation: Mask ventilation without difficulty LMA: LMA inserted LMA Size: 5.0 Number of attempts: 1 Tube secured with: Tape Dental Injury: Teeth and Oropharynx as per pre-operative assessment

## 2019-09-24 NOTE — Discharge Instructions (Addendum)
DISCHARGE INSTRUCTIONS FOR KIDNEY STONE/URETERAL STENT   MEDICATIONS:  1. Resume all your other meds from home.  2.  AZO (over-the-counter) can help with the burning/stinging when you urinate.  ACTIVITY:  1. May resume regular activities in 24 hours. 2. No driving while on narcotic pain medications  3. Drink plenty of water  4. Continue to walk at home - you can still get blood clots when you are at home, so keep active, but don't over do it.  5. May return to work/school tomorrow or when you feel ready   BATHING:  1. You can shower. 2. You have a string coming from your urethra: The stent string is attached to your ureteral stent. Do not pull on this.   SIGNS/SYMPTOMS TO CALL:  Please call us if you have a fever greater than 101.5, uncontrolled nausea/vomiting, uncontrolled pain, dizziness, unable to urinate, excessively bloody urine, chest pain, shortness of breath, leg swelling, leg pain, or any other concerns or questions.   Common postoperative symptoms include urinary frequency, urgency, burning with urination and bladder spasms.  Blood in the urine is normal.  You can reach [place] at [phone].   FOLLOW-UP:  1. You will be contacted regarding a postop follow-up appointment in approximately 1 month 2. You have a string attached to your stent, you may remove it on Thursday, 09/26/2019. To do this, pull the string until the stent is completely removed. You may feel an odd sensation in your back.   AMBULATORY SURGERY  DISCHARGE INSTRUCTIONS   1) The drugs that you were given will stay in your system until tomorrow so for the next 24 hours you should not:  A) Drive an automobile B) Make any legal decisions C) Drink any alcoholic beverage   2) You may resume regular meals tomorrow.  Today it is better to start with liquids and gradually work up to solid foods.  You may eat anything you prefer, but it is better to start with liquids, then soup and crackers, and gradually work  up to solid foods.   3) Please notify your doctor immediately if you have any unusual bleeding, trouble breathing, redness and pain at the surgery site, drainage, fever, or pain not relieved by medication.    4) Additional Instructions:        Please contact your physician with any problems or Same Day Surgery at [phone], Monday through Friday 6 am to 4 pm, or [place] at [facility] number at [phone].

## 2019-09-25 ENCOUNTER — Encounter: Payer: Self-pay | Admitting: Urology

## 2019-09-30 LAB — CALCULI, WITH PHOTOGRAPH (CLINICAL LAB)
Calcium Oxalate Dihydrate: 90 %
Calcium Oxalate Monohydrate: 10 %
Weight Calculi: 15 mg

## 2019-10-23 ENCOUNTER — Other Ambulatory Visit: Payer: Self-pay

## 2019-10-23 ENCOUNTER — Encounter: Payer: Self-pay | Admitting: Urology

## 2019-10-23 ENCOUNTER — Ambulatory Visit: Payer: BC Managed Care – PPO | Admitting: Urology

## 2019-10-23 ENCOUNTER — Ambulatory Visit (INDEPENDENT_AMBULATORY_CARE_PROVIDER_SITE_OTHER): Payer: BC Managed Care – PPO | Admitting: Urology

## 2019-10-23 VITALS — BP 120/76 | HR 68 | Ht 71.0 in | Wt 250.0 lb

## 2019-10-23 DIAGNOSIS — Z87442 Personal history of urinary calculi: Secondary | ICD-10-CM

## 2019-10-23 NOTE — Progress Notes (Signed)
10/23/2019 11:12 AM   Glenn Serrano [birth year] [id number]  Referring provider: Duanne Limerick, MD [address] [place],  [place] [address]  Chief Complaint  Patient presents with  . Other    HPI: 39 y.o. male presents for postop follow-up    Ureteroscopic removal of an 8 mm left proximal ureteral calculus 09/24/2019  Initial ureteroscopy 8/24 could not be treated due to anatomy and patient was stented  Remove stent 48 hours postop and had no problems after stent removal  No complaints today and asymptomatic  Stone analysis monohydrate/dihydrate 10/90  Prior stone 2016   PMH: Past Medical History:  Diagnosis Date  . Erectile dysfunction   . Hematuria   . History of kidney stones   . Migraine headache    none in last 2 yrs    Surgical History: Past Surgical History:  Procedure Laterality Date  . APPENDECTOMY    . CYSTOSCOPY W/ RETROGRADES Left 09/10/2019   Procedure: CYSTOSCOPY WITH RETROGRADE PYELOGRAM;  Surgeon: Riki Altes, MD;  Location: [facility];  Service: Urology;  Laterality: Left;  . CYSTOSCOPY W/ RETROGRADES Left 09/24/2019   Procedure: CYSTOSCOPY WITH RETROGRADE PYELOGRAM;  Surgeon: Riki Altes, MD;  Location: [facility];  Service: Urology;  Laterality: Left;  . CYSTOSCOPY WITH STENT PLACEMENT Left 09/10/2019   Procedure: CYSTOSCOPY WITH STENT PLACEMENT;  Surgeon: Riki Altes, MD;  Location: [facility];  Service: Urology;  Laterality: Left;  . CYSTOSCOPY WITH URETHRAL DILATATION Left 09/10/2019   Procedure: CYSTOSCOPY WITH URETHRAL DILATATION;  Surgeon: Riki Altes, MD;  Location: [facility];  Service: Urology;  Laterality: Left;  . CYSTOSCOPY/URETEROSCOPY/HOLMIUM LASER/STENT PLACEMENT Left 09/24/2019   Procedure: CYSTOSCOPY/URETEROSCOPY/HOLMIUM LASER/STENT Exchange;  Surgeon: Riki Altes, MD;  Location: [facility];  Service: Urology;  Laterality: Left;  . ELBOW SURGERY    . MYRINGOTOMY WITH TUBE PLACEMENT Right 08/16/2018    Procedure: MYRINGOTOMY WITH TUBE PLACEMENT;  Surgeon: Vernie Murders, MD;  Location: [facility];  Service: ENT;  Laterality: Right;  . NASOPHARYNGOSCOPY EUSTATION TUBE BALLOON DILATION Bilateral 08/16/2018   Procedure: NASOPHARYNGOSCOPY EUSTATION TUBE BALLOON DILATION;  Surgeon: Vernie Murders, MD;  Location: [facility];  Service: ENT;  Laterality: Bilateral;  . TONSILLECTOMY    . TURBINATE REDUCTION Bilateral 08/16/2018   Procedure: TURBINATE REDUCTION Outfracture inferior;  Surgeon: Vernie Murders, MD;  Location: [facility];  Service: ENT;  Laterality: Bilateral;  . URETEROSCOPY Left 09/10/2019   Procedure: URETEROSCOPY;  Surgeon: Riki Altes, MD;  Location: [facility];  Service: Urology;  Laterality: Left;    Home Medications:  Allergies as of 10/23/2019      Reactions   Amoxicillin Shortness Of Breath, Rash   Penicillins Hives      Medication List       Accurate as of October 23, 2019 11:12 AM. If you have any questions, ask your nurse or doctor.        STOP taking these medications   ketorolac 10 MG tablet Commonly known as: TORADOL Stopped by: Riki Altes, MD   ondansetron 4 MG disintegrating tablet Commonly known as: Zofran ODT Stopped by: Riki Altes, MD   oxybutynin 5 MG tablet Commonly known as: DITROPAN Stopped by: Riki Altes, MD   oxyCODONE-acetaminophen 5-325 MG tablet Commonly known as: PERCOCET/ROXICET Stopped by: Riki Altes, MD   tamsulosin 0.4 MG Caps capsule Commonly known as: FLOMAX Stopped by: Riki Altes, MD       Allergies:  Allergies  Allergen Reactions  . Amoxicillin Shortness Of Breath and Rash  . Penicillins Hives    Family History: Family History  Problem Relation Age of Onset  . Hematuria Other        paternal  . Kidney cancer Other        paternal  . Prostate cancer Other        paternal  . Kidney failure Other        paternal    Social History:  reports that he quit smoking  about 4 years ago. His smoking use included cigarettes and cigars. He has a 16.00 pack-year smoking history. He has quit using smokeless tobacco. He reports current alcohol use. He reports that he does not use drugs.   Physical Exam: BP 120/76   Pulse 68   Ht 5\' 11"  (1.803 m)   Wt 250 lb (113.4 kg)   BMI 34.87 kg/m   Constitutional:  Alert and oriented, No acute distress. HEENT: [place] AT, moist mucus membranes.  Trachea midline, no masses. Cardiovascular: No clubbing, cyanosis, or edema. Respiratory: Normal respiratory effort, no increased work of breathing.   Assessment & Plan:    Doing well status post ureteroscopic stone removal  CT showed no additional calculi however prior stone 2016  Recommended proceeding with a metabolic evaluation to include 24-hour urine study and blood work  Instructed to continue regular diet and fluid intake at time of collection  Will call with results  Follow-up 1 year with KUB   2017, MD  [facility] [address] [place], [place] [phone]

## 2020-10-21 ENCOUNTER — Other Ambulatory Visit: Payer: Self-pay | Admitting: Family Medicine

## 2020-10-21 DIAGNOSIS — Z87442 Personal history of urinary calculi: Secondary | ICD-10-CM

## 2020-10-22 ENCOUNTER — Ambulatory Visit: Payer: Self-pay | Admitting: Urology

## 2020-10-23 ENCOUNTER — Ambulatory Visit: Payer: Self-pay | Admitting: Urology

## 2020-10-26 ENCOUNTER — Encounter: Payer: Self-pay | Admitting: Urology

## 2020-10-26 ENCOUNTER — Ambulatory Visit: Payer: Self-pay | Admitting: Urology

## 2021-01-23 IMAGING — CR DG ABDOMEN 1V
2 series · 2 of 2 positions shown · non-contrast
Comparison: 09/05/2019

CLINICAL DATA: Left ureteral stone

EXAM:
ABDOMEN - 1 VIEW

[abdomen kub (1 of 2)]
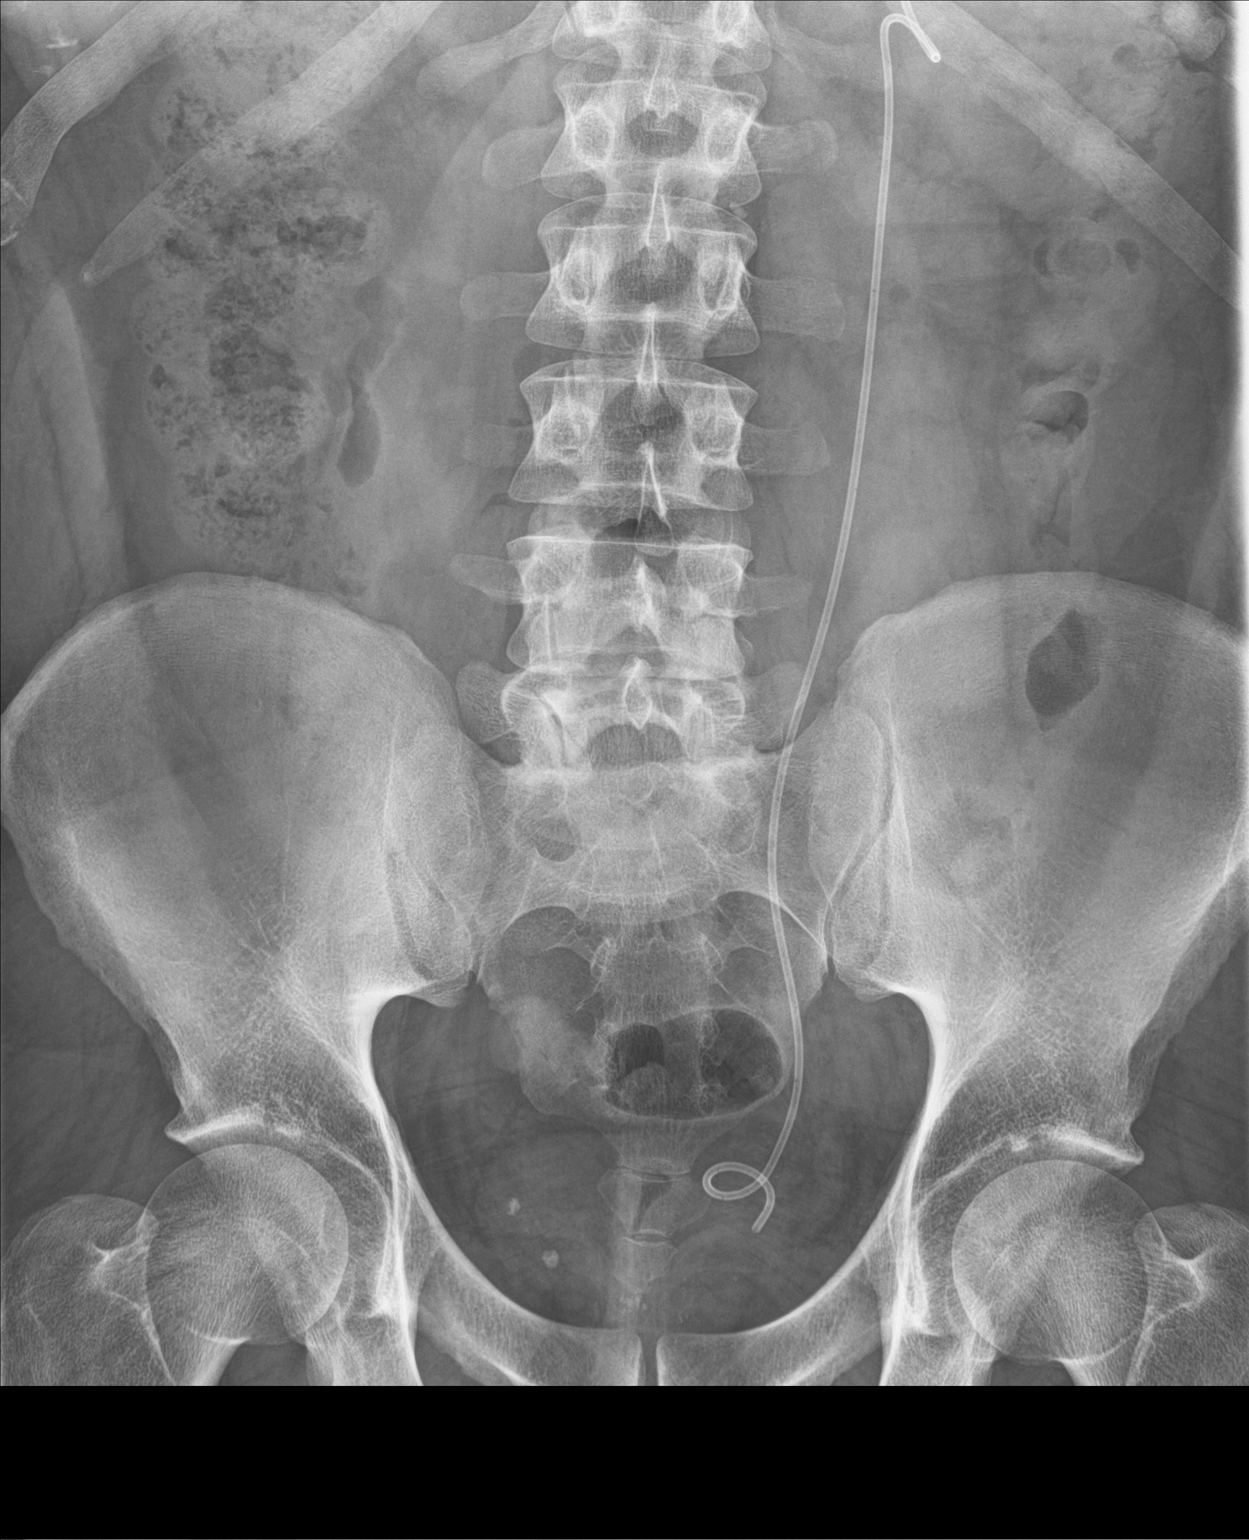

[abdomen kub (2 of 2)]
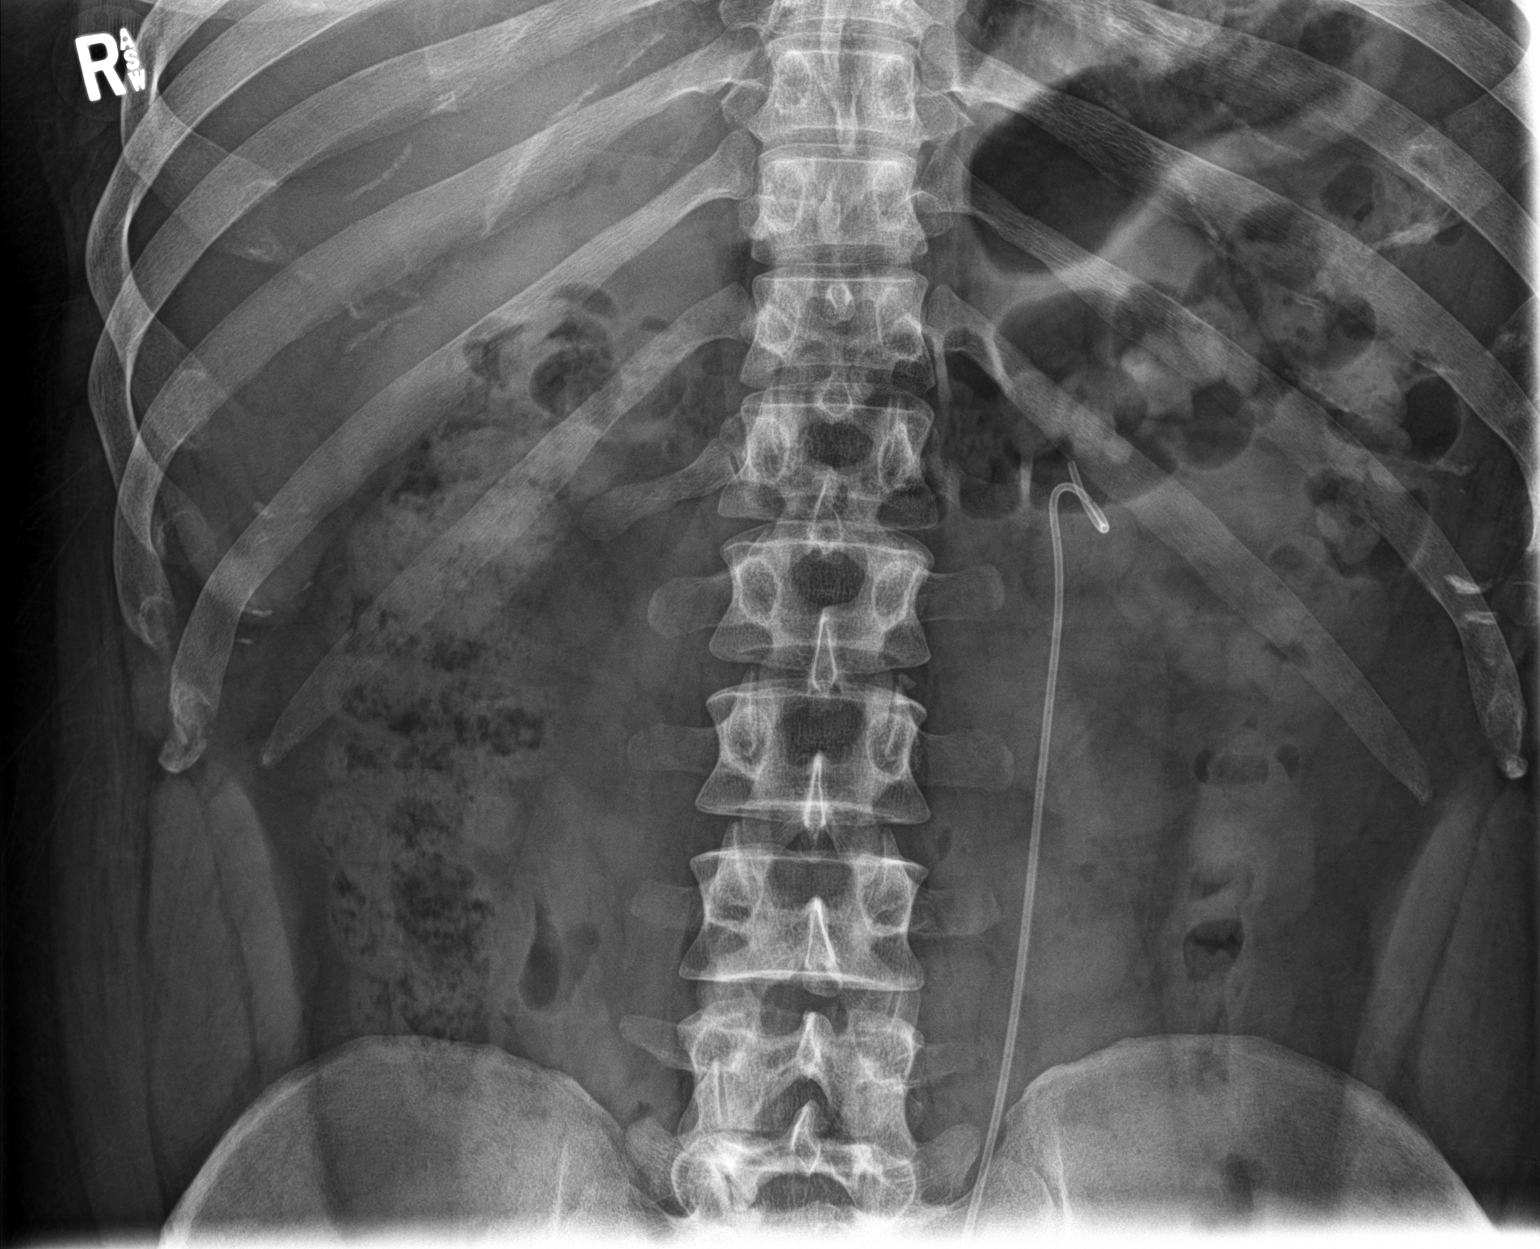

[2 of 2 positions shown; findings below may reference images not displayed]

FINDINGS: Interval placement of left-sided double-J ureteral stent, formed
pigtails in the expected vicinity of the renal pelvis and urinary
bladder. No calculi noted radiographically. Phleboliths in the low
right pelvis. Nonobstructive pattern of bowel gas.
IMPRESSION: Interval placement of left-sided double-J ureteral stent. No calculi
noted radiographically.

## 2021-05-01 ENCOUNTER — Encounter: Payer: Self-pay | Admitting: Emergency Medicine

## 2021-05-01 ENCOUNTER — Emergency Department: Payer: Managed Care, Other (non HMO)

## 2021-05-01 ENCOUNTER — Emergency Department
Admission: EM | Admit: 2021-05-01 | Discharge: 2021-05-01 | Disposition: A | Discharge status: Home / Self Care | Payer: Managed Care, Other (non HMO) | Attending: Emergency Medicine | Admitting: Emergency Medicine

## 2021-05-01 DIAGNOSIS — S4992XA Unspecified injury of left shoulder and upper arm, initial encounter: Secondary | ICD-10-CM | POA: Diagnosis present

## 2021-05-01 DIAGNOSIS — S46212A Strain of muscle, fascia and tendon of other parts of biceps, left arm, initial encounter: Secondary | ICD-10-CM | POA: Insufficient documentation

## 2021-05-01 DIAGNOSIS — X509XXA Other and unspecified overexertion or strenuous movements or postures, initial encounter: Secondary | ICD-10-CM | POA: Diagnosis not present

## 2021-05-01 MED ORDER — KETOROLAC TROMETHAMINE 30 MG/ML IJ SOLN
30.0000 mg | Freq: Once | INTRAMUSCULAR | Status: AC
  Administered 2021-05-01: 30 mg via INTRAMUSCULAR
  Filled 2021-05-01: qty 1

## 2021-05-01 MED ORDER — NABUMETONE 750 MG PO TABS: 750.0000 mg | ORAL_TABLET | Freq: Two times a day (BID) | ORAL | 0 refills | Status: AC

## 2021-05-01 MED ORDER — CYCLOBENZAPRINE HCL 5 MG PO TABS: 5.0000 mg | ORAL_TABLET | Freq: Three times a day (TID) | ORAL | 0 refills | Status: DC | PRN

## 2021-05-01 MED ORDER — HYDROCODONE-ACETAMINOPHEN 5-325 MG PO TABS: 1.0000 | ORAL_TABLET | Freq: Three times a day (TID) | ORAL | 0 refills | Status: AC | PRN

## 2021-05-01 MED ORDER — OXYCODONE-ACETAMINOPHEN 5-325 MG PO TABS
1.0000 | ORAL_TABLET | Freq: Once | ORAL | Status: AC
  Administered 2021-05-01: 1 via ORAL
  Filled 2021-05-01: qty 1

## 2021-05-01 NOTE — ED Triage Notes (Signed)
Pt to ED via POV, Pt states that he thinks that he tore his left Bicep. Pt states that he was moving a couch and heard 3 popping sounds. Pt is in NAD.  ?

## 2021-05-01 NOTE — ED Provider Notes (Signed)
? ? ?[facility] ?Emergency Department Provider Note ? ? ? ? Event Date/Time  ? First MD Initiated Contact with Patient 05/01/21 1217   ?  (approximate) ? ? ?History  ? ?Arm Pain ? ? ?HPI ? ?Glenn Serrano is a 41 y.o. male with noncontributory medical history, presents to the ED with pain and disability to his left bicep tendon.  Patient was moved a large couch, when he heard 3 pops to the proximal arm and elbow.  Denies any head injury, LOC, or chest pain. ?  ? ? ?Physical Exam  ? ?Triage Vital Signs: ?ED Triage Vitals [05/01/21 1124]  ?Enc Vitals Group  ?   BP (!) 122/104  ?   Pulse Rate 67  ?   Resp 16  ?   Temp 98.6 ?F (37 ?C)  ?   Temp Source Oral  ?   SpO2 96 %  ?   Weight 250 lb (113.4 kg)  ?   Height 5\' 10"  (1.778 m)  ?   Head Circumference   ?   Peak Flow   ?   Pain Score 7  ?   Pain Loc   ?   Pain Edu?   ?   Excl. in GC?   ? ? ?Most recent vital signs: ?Vitals:  ? 05/01/21 1124  ?BP: (!) 122/104  ?Pulse: 67  ?Resp: 16  ?Temp: 98.6 ?F (37 ?C)  ?SpO2: 96%  ? ? ?General Awake, no distress.  ?CV:  Good peripheral perfusion.  ?RESP:  Normal effort.  ?ABD:  No distention.  ?MSK:  Left arm without obvious evidence of dislocation or fracture.  Patient with the arm held in a flexed abducted position.  Pain to the medial elbow at the antecubital space.  Patient with some mild bicep muscle deformity.  Patient with limited supination and flexion range at the elbow.  Normal composite fist distally. ? ? ?ED Results / Procedures / Treatments  ? ?Labs ?(all labs ordered are listed, but only abnormal results are displayed) ?Labs Reviewed - No data to display ? ? ?EKG ? ? ?RADIOLOGY ? ?I personally viewed and evaluated these images as part of my medical decision making, as well as reviewing the written report by the radiologist. ? ?ED Provider Interpretation: evidence of distal biceps rupture} ? ?MRI Left Elbow w/o CM ? ?IMPRESSION: ?1. Complete tear of the distal biceps tendon from the  radial ?tuberosity with 4.7 cm of retraction. ?2. Mild common forearm flexor tendinosis. ? ?PROCEDURES: ? ?Critical Care performed: No ? ?Procedures ? ? ?MEDICATIONS ORDERED IN ED: ?Medications  ?ketorolac (TORADOL) 30 MG/ML injection 30 mg (30 mg Intramuscular Given 05/01/21 1252)  ?oxyCODONE-acetaminophen (PERCOCET/ROXICET) 5-325 MG per tablet 1 tablet (1 tablet Oral Given 05/01/21 1509)  ? ? ? ?IMPRESSION / MDM / ASSESSMENT AND PLAN / ED COURSE  ?I reviewed the triage vital signs and the nursing notes. ?             ?               ? ?Differential diagnosis includes, but is not limited to biceps tendon rupture, muscle strain, shoulder dislocation ? ?----------------------------------------- ?3:06 PM on 05/01/2021 ?----------------------------------------- ?Patient requesting pain medicine at this time.  He reports that he will have his father-in-law pick him up from the ED as he initially presented himself solo via personal vehicle. ? ?----------------------------------------- ?3:05 PM on 05/01/2021 ?----------------------------------------- ?S/W 05/03/2021: he will review the images.  He will  have his office contact the patient on Monday, and evaluate him on Tuesday, with expected surgical intervention by Friday. ? ?Patient's diagnosis is consistent with distal biceps tendon tear. Patient will be discharged home with prescriptions for cyclobenzaprine, hydrocodone, Relafen.  Patient is advised to hold anti-inflammatories daily is evaluated by Ortho.  Patient is to follow up with Ortho as needed or otherwise directed. Patient is given ED precautions to return to the ED for any worsening or new symptoms. ? ? ?FINAL CLINICAL IMPRESSION(S) / ED DIAGNOSES  ? ?Final diagnoses:  ?Rupture of left distal biceps tendon, initial encounter  ? ? ? ?Rx / DC Orders  ? ?ED Discharge Orders   ? ?      Ordered  ?  cyclobenzaprine (FLEXERIL) 5 MG tablet  3 times daily PRN       ? 05/01/21 1358  ?  HYDROcodone-acetaminophen (NORCO)  5-325 MG tablet  3 times daily PRN       ? 05/01/21 1358  ?  nabumetone (RELAFEN) 750 MG tablet  2 times daily       ? 05/01/21 1358  ? ?  ?  ? ?  ? ? ? ?Note:  This document was prepared using Dragon voice recognition software and may include unintentional dictation errors. ? ?  ?Lissa Hoard, PA-C ?05/02/21 1935 ? ?  ?Arnaldo Natal, MD ?05/07/21 1524 ? ?

## 2021-05-01 NOTE — Discharge Instructions (Addendum)
Take the prescription meds as directed. Follow-up with Ortho for further management. Do NOT start the Relafen until after surgery.  ?

## 2021-05-04 ENCOUNTER — Other Ambulatory Visit: Payer: Self-pay | Admitting: Orthopedic Surgery

## 2021-05-05 ENCOUNTER — Other Ambulatory Visit
Admission: RE | Admit: 2021-05-05 | Discharge: 2021-05-05 | Disposition: A | Discharge status: Home / Self Care | Payer: Managed Care, Other (non HMO) | Source: Ambulatory Visit | Attending: Orthopedic Surgery | Admitting: Orthopedic Surgery

## 2021-05-05 ENCOUNTER — Other Ambulatory Visit: Payer: Self-pay

## 2021-05-05 MED ORDER — FAMOTIDINE 20 MG PO TABS
20.0000 mg | ORAL_TABLET | Freq: Once | ORAL | Status: DC
  Filled 2021-05-05: qty 1

## 2021-05-05 NOTE — Patient Instructions (Addendum)
Your procedure is scheduled on: 05/06/21 - Thursday ?Report to the Registration Desk on the 1st floor of the [facility]. ?To find out your arrival time, please call [phone] between 1PM - 3PM on: 05/05/21 - Wednesday ? ?REMEMBER: ?Instructions that are not followed completely may result in serious medical risk, up to and including death; or upon the discretion of your surgeon and anesthesiologist your surgery may need to be rescheduled. ? ?Do not eat food after midnight the night before surgery.  ?No gum chewing, lozengers or hard candies. ? ?You may however, drink CLEAR liquids up to 2 hours before you are scheduled to arrive for your surgery. Do not drink anything within 2 hours of your scheduled arrival time. ? ?Clear liquids include: ?- water  ?- apple juice without pulp ?- gatorade (not RED colors) ?- black coffee or tea (Do NOT add milk or creamers to the coffee or tea) ?Do NOT drink anything that is not on this list. ? ?TAKE THESE MEDICATIONS THE MORNING OF SURGERY WITH A SIP OF WATER: NONE ? ?One week prior to surgery: ?Stop Anti-inflammatories (NSAIDS) such as Advil, Aleve, Ibuprofen, Motrin, Naproxen, Naprosyn and Aspirin based products such as Excedrin, Goodys Powder, BC Powder. ? ?Stop ANY OVER THE COUNTER supplements until after surgery. ? ?You may however, continue to take Tylenol if needed for pain up until the day of surgery. ? ?No Alcohol for 24 hours before or after surgery. ? ?No Smoking including e-cigarettes for 24 hours prior to surgery.  ?No chewable tobacco products for at least 6 hours prior to surgery.  ?No nicotine patches on the day of surgery. ? ?Do not use any "recreational" drugs for at least a week prior to your surgery.  ?Please be advised that the combination of cocaine and anesthesia may have negative outcomes, up to and including death. ?If you test positive for cocaine, your surgery will be cancelled. ? ?On the morning of surgery brush your teeth with toothpaste and  water, you may rinse your mouth with mouthwash if you wish. ?Do not swallow any toothpaste or mouthwash. ? ?Do not wear jewelry, make-up, hairpins, clips or nail polish. ? ?Do not wear lotions, powders, or perfumes.  ? ?Do not shave body from the neck down 48 hours prior to surgery just in case you cut yourself which could leave a site for infection.  ?Also, freshly shaved skin may become irritated if using the CHG soap. ? ?Contact lenses, hearing aids and dentures may not be worn into surgery. ? ?Do not bring valuables to the hospital. [facility] is not responsible for any missing/lost belongings or valuables.  ? ?Notify your doctor if there is any change in your medical condition (cold, fever, infection). ? ?Wear comfortable clothing (specific to your surgery type) to the hospital. ? ?After surgery, you can help prevent lung complications by doing breathing exercises.  ?Take deep breaths and cough every 1-2 hours. Your doctor may order a device called an Incentive Spirometer to help you take deep breaths. ?When coughing or sneezing, hold a pillow firmly against your incision with both hands. This is called ?splinting.? Doing this helps protect your incision. It also decreases belly discomfort. ? ?If you are being admitted to the hospital overnight, leave your suitcase in the car. ?After surgery it may be brought to your room. ? ?If you are being discharged the day of surgery, you will not be allowed to drive home. ?You will need a responsible adult (18 years or older)  to drive you home and stay with you that night.  ? ?If you are taking public transportation, you will need to have a responsible adult (18 years or older) with you. ?Please confirm with your physician that it is acceptable to use public transportation.  ? ?Please call the Pre-admissions Testing Dept. at [phone] if you have any questions about these instructions. ? ?Surgery Visitation Policy: ? ?Patients undergoing a surgery or procedure may  have two family members or support persons with them as long as the person is not COVID-19 positive or experiencing its symptoms.  ? ?Inpatient Visitation:   ? ?Visiting hours are 7 a.m. to 8 p.m. ?Up to four visitors are allowed at one time in a patient room, including children. The visitors may rotate out with other people during the day. One designated support person (adult) may remain overnight.  ?

## 2021-05-06 ENCOUNTER — Other Ambulatory Visit: Payer: Self-pay

## 2021-05-06 ENCOUNTER — Ambulatory Visit: Payer: Managed Care, Other (non HMO) | Admitting: Certified Registered Nurse Anesthetist

## 2021-05-06 ENCOUNTER — Encounter
Admission: RE | Disposition: A | Discharge status: Home / Self Care | Payer: Self-pay | Source: Home / Self Care | Attending: Orthopedic Surgery

## 2021-05-06 ENCOUNTER — Ambulatory Visit: Payer: Managed Care, Other (non HMO)

## 2021-05-06 ENCOUNTER — Ambulatory Visit
Admission: RE | Admit: 2021-05-06 | Discharge: 2021-05-06 | Disposition: A | Discharge status: Home / Self Care | Payer: Managed Care, Other (non HMO) | Attending: Orthopedic Surgery | Admitting: Orthopedic Surgery

## 2021-05-06 ENCOUNTER — Encounter: Payer: Self-pay | Admitting: Orthopedic Surgery

## 2021-05-06 DIAGNOSIS — X500XXA Overexertion from strenuous movement or load, initial encounter: Secondary | ICD-10-CM | POA: Diagnosis not present

## 2021-05-06 DIAGNOSIS — S46212A Strain of muscle, fascia and tendon of other parts of biceps, left arm, initial encounter: Secondary | ICD-10-CM | POA: Insufficient documentation

## 2021-05-06 DIAGNOSIS — F17200 Nicotine dependence, unspecified, uncomplicated: Secondary | ICD-10-CM | POA: Diagnosis not present

## 2021-05-06 HISTORY — PX: DISTAL BICEPS TENDON REPAIR: SHX1461

## 2021-05-06 SURGERY — REPAIR, TENDON, BICEPS, DISTAL
Anesthesia: General | Site: Arm Upper | Laterality: Left

## 2021-05-06 MED ORDER — SUGAMMADEX SODIUM 200 MG/2ML IV SOLN
INTRAVENOUS | Status: DC | PRN
  Administered 2021-05-06: 250 mg via INTRAVENOUS

## 2021-05-06 MED ORDER — ROPIVACAINE HCL 5 MG/ML IJ SOLN
INTRAMUSCULAR | Status: AC
  Filled 2021-05-06: qty 20

## 2021-05-06 MED ORDER — CEFAZOLIN SODIUM-DEXTROSE 2-4 GM/100ML-% IV SOLN: 2.0000 g | INTRAVENOUS | Status: DC

## 2021-05-06 MED ORDER — BUPIVACAINE LIPOSOME 1.3 % IJ SUSP
INTRAMUSCULAR | Status: AC
  Filled 2021-05-06: qty 20

## 2021-05-06 MED ORDER — 0.9 % SODIUM CHLORIDE (POUR BTL) OPTIME
TOPICAL | Status: DC | PRN
Start: 2021-05-06 — End: 2021-05-06
  Administered 2021-05-06: 500 mL

## 2021-05-06 MED ORDER — SUCCINYLCHOLINE CHLORIDE 200 MG/10ML IV SOSY
PREFILLED_SYRINGE | INTRAVENOUS | Status: DC | PRN
  Administered 2021-05-06: 140 mg via INTRAVENOUS

## 2021-05-06 MED ORDER — ORAL CARE MOUTH RINSE: 15.0000 mL | Freq: Once | OROMUCOSAL | Status: DC

## 2021-05-06 MED ORDER — GLYCOPYRROLATE 0.2 MG/ML IJ SOLN
INTRAMUSCULAR | Status: DC | PRN
  Administered 2021-05-06: .2 mg via INTRAVENOUS

## 2021-05-06 MED ORDER — ROCURONIUM BROMIDE 100 MG/10ML IV SOLN
INTRAVENOUS | Status: DC | PRN
  Administered 2021-05-06: 40 mg via INTRAVENOUS
  Administered 2021-05-06: 20 mg via INTRAVENOUS

## 2021-05-06 MED ORDER — ROPIVACAINE HCL 5 MG/ML IJ SOLN
INTRAMUSCULAR | Status: DC | PRN
  Administered 2021-05-06: 5 mL

## 2021-05-06 MED ORDER — BUPIVACAINE LIPOSOME 1.3 % IJ SUSP
INTRAMUSCULAR | Status: AC
Start: 2021-05-06 — End: ?
  Filled 2021-05-06: qty 10

## 2021-05-06 MED ORDER — DEXAMETHASONE SODIUM PHOSPHATE 10 MG/ML IJ SOLN
INTRAMUSCULAR | Status: DC | PRN
  Administered 2021-05-06: 10 mg via INTRAVENOUS

## 2021-05-06 MED ORDER — PROPOFOL 10 MG/ML IV BOLUS
INTRAVENOUS | Status: AC
  Filled 2021-05-06: qty 20

## 2021-05-06 MED ORDER — CLINDAMYCIN PHOSPHATE 900 MG/50ML IV SOLN
INTRAVENOUS | Status: AC
  Filled 2021-05-06: qty 50

## 2021-05-06 MED ORDER — ACETAMINOPHEN 10 MG/ML IV SOLN
INTRAVENOUS | Status: DC | PRN
  Administered 2021-05-06: 1000 mg via INTRAVENOUS

## 2021-05-06 MED ORDER — OXYCODONE HCL 5 MG PO TABS: 5.0000 mg | ORAL_TABLET | Freq: Once | ORAL | Status: AC | PRN

## 2021-05-06 MED ORDER — FENTANYL CITRATE (PF) 100 MCG/2ML IJ SOLN
INTRAMUSCULAR | Status: AC
  Filled 2021-05-06: qty 2

## 2021-05-06 MED ORDER — FENTANYL CITRATE (PF) 100 MCG/2ML IJ SOLN
25.0000 ug | INTRAMUSCULAR | Status: DC | PRN
  Administered 2021-05-06: 25 ug via INTRAVENOUS

## 2021-05-06 MED ORDER — LIDOCAINE HCL (CARDIAC) PF 100 MG/5ML IV SOSY
PREFILLED_SYRINGE | INTRAVENOUS | Status: DC | PRN
  Administered 2021-05-06: 100 mg via INTRAVENOUS

## 2021-05-06 MED ORDER — ACETAMINOPHEN 500 MG PO TABS: 1000.0000 mg | ORAL_TABLET | Freq: Three times a day (TID) | ORAL | 2 refills | Status: DC

## 2021-05-06 MED ORDER — ACETAMINOPHEN 10 MG/ML IV SOLN
INTRAVENOUS | Status: AC
  Filled 2021-05-06: qty 100

## 2021-05-06 MED ORDER — ONDANSETRON 4 MG PO TBDP: 4.0000 mg | ORAL_TABLET | Freq: Three times a day (TID) | ORAL | 0 refills | Status: DC | PRN

## 2021-05-06 MED ORDER — OXYCODONE HCL 5 MG PO TABS: 5.0000 mg | ORAL_TABLET | ORAL | 0 refills | Status: DC | PRN

## 2021-05-06 MED ORDER — IPRATROPIUM-ALBUTEROL 0.5-2.5 (3) MG/3ML IN SOLN
RESPIRATORY_TRACT | Status: AC
  Filled 2021-05-06: qty 3

## 2021-05-06 MED ORDER — KETAMINE HCL 10 MG/ML IJ SOLN
INTRAMUSCULAR | Status: DC | PRN
Start: 2021-05-06 — End: 2021-05-06
  Administered 2021-05-06: 25 mg via INTRAVENOUS

## 2021-05-06 MED ORDER — FENTANYL CITRATE (PF) 100 MCG/2ML IJ SOLN
INTRAMUSCULAR | Status: AC
  Administered 2021-05-06: 25 ug via INTRAVENOUS
  Filled 2021-05-06: qty 2

## 2021-05-06 MED ORDER — OXYCODONE HCL 5 MG PO TABS
ORAL_TABLET | ORAL | Status: AC
  Administered 2021-05-06: 5 mg via ORAL
  Filled 2021-05-06: qty 1

## 2021-05-06 MED ORDER — IPRATROPIUM-ALBUTEROL 0.5-2.5 (3) MG/3ML IN SOLN
3.0000 mL | Freq: Once | RESPIRATORY_TRACT | Status: AC | PRN
  Administered 2021-05-06: 3 mL via RESPIRATORY_TRACT

## 2021-05-06 MED ORDER — MIDAZOLAM HCL 2 MG/2ML IJ SOLN: 1.0000 mg | Freq: Once | INTRAMUSCULAR | Status: DC

## 2021-05-06 MED ORDER — ONDANSETRON HCL 4 MG/2ML IJ SOLN
INTRAMUSCULAR | Status: DC | PRN
  Administered 2021-05-06: 4 mg via INTRAVENOUS

## 2021-05-06 MED ORDER — FENTANYL CITRATE (PF) 100 MCG/2ML IJ SOLN
INTRAMUSCULAR | Status: DC | PRN
  Administered 2021-05-06: 100 ug via INTRAVENOUS

## 2021-05-06 MED ORDER — OXYCODONE HCL 5 MG/5ML PO SOLN: 5.0000 mg | Freq: Once | ORAL | Status: AC | PRN

## 2021-05-06 MED ORDER — CHLORHEXIDINE GLUCONATE 0.12 % MT SOLN
OROMUCOSAL | Status: DC
Start: 2021-05-06 — End: 2021-05-06
  Filled 2021-05-06: qty 15

## 2021-05-06 MED ORDER — KETAMINE HCL 50 MG/5ML IJ SOSY
PREFILLED_SYRINGE | INTRAMUSCULAR | Status: AC
  Filled 2021-05-06: qty 5

## 2021-05-06 MED ORDER — ASPIRIN EC 325 MG PO TBEC: 325.0000 mg | DELAYED_RELEASE_TABLET | Freq: Every day | ORAL | 0 refills | Status: AC

## 2021-05-06 MED ORDER — CLINDAMYCIN PHOSPHATE 900 MG/50ML IV SOLN
900.0000 mg | Freq: Once | INTRAVENOUS | Status: AC
  Administered 2021-05-06: 900 mg via INTRAVENOUS

## 2021-05-06 MED ORDER — MIDAZOLAM HCL 2 MG/2ML IJ SOLN
INTRAMUSCULAR | Status: DC | PRN
Start: 2021-05-06 — End: 2021-05-06
  Administered 2021-05-06: 2 mg via INTRAVENOUS

## 2021-05-06 MED ORDER — LACTATED RINGERS IV SOLN: INTRAVENOUS | Status: DC

## 2021-05-06 MED ORDER — CHLORHEXIDINE GLUCONATE 0.12 % MT SOLN: 15.0000 mL | Freq: Once | OROMUCOSAL | Status: DC

## 2021-05-06 MED ORDER — BUPIVACAINE LIPOSOME 1.3 % IJ SUSP
INTRAMUSCULAR | Status: DC | PRN
  Administered 2021-05-06: 10 mL

## 2021-05-06 MED ORDER — FENTANYL CITRATE PF 50 MCG/ML IJ SOSY: 50.0000 ug | PREFILLED_SYRINGE | Freq: Once | INTRAMUSCULAR | Status: DC

## 2021-05-06 MED ORDER — PROPOFOL 10 MG/ML IV BOLUS
INTRAVENOUS | Status: DC | PRN
  Administered 2021-05-06: 200 mg via INTRAVENOUS

## 2021-05-06 SURGICAL SUPPLY — 44 items
ADH SKN CLS APL DERMABOND .7 (GAUZE/BANDAGES/DRESSINGS) ×1
APL PRP STRL LF DISP 70% ISPRP (MISCELLANEOUS) ×2
BNDG COHESIVE 4X5 TAN ST LF (GAUZE/BANDAGES/DRESSINGS) ×2 IMPLANT
BNDG ELASTIC 4X5.8 VLCR STR LF (GAUZE/BANDAGES/DRESSINGS) ×2 IMPLANT
BNDG ESMARK 4X12 TAN STRL LF (GAUZE/BANDAGES/DRESSINGS) ×1 IMPLANT
CHLORAPREP W/TINT 26 (MISCELLANEOUS) ×3 IMPLANT
CORD BIP STRL DISP 12FT (MISCELLANEOUS) ×2 IMPLANT
CUFF TOURN SGL QUICK 18X4 (TOURNIQUET CUFF) IMPLANT
CUFF TOURN SGL QUICK 24 (TOURNIQUET CUFF)
CUFF TRNQT CYL 24X4X16.5-23 (TOURNIQUET CUFF) IMPLANT
DERMABOND ADVANCED (GAUZE/BANDAGES/DRESSINGS) ×1
DERMABOND ADVANCED .7 DNX12 (GAUZE/BANDAGES/DRESSINGS) ×1 IMPLANT
DRAPE FLUOR MINI C-ARM 54X84 (DRAPES) ×2 IMPLANT
DRAPE SURG 17X11 SM STRL (DRAPES) ×2 IMPLANT
DRAPE U-SHAPE 47X51 STRL (DRAPES) ×2 IMPLANT
ELECT REM PT RETURN 9FT ADLT (ELECTROSURGICAL) ×2
ELECTRODE REM PT RTRN 9FT ADLT (ELECTROSURGICAL) ×1 IMPLANT
FORCEPS JEWEL BIP 4-3/4 STR (INSTRUMENTS) ×2 IMPLANT
GAUZE SPONGE 4X4 12PLY STRL (GAUZE/BANDAGES/DRESSINGS) ×2 IMPLANT
GLOVE SURG ORTHO 8.0 STRL STRW (GLOVE) ×6 IMPLANT
GLOVE SURG UNDER LTX SZ8 (GLOVE) ×2 IMPLANT
GOWN STRL REUS W/ TWL LRG LVL3 (GOWN DISPOSABLE) ×2 IMPLANT
GOWN STRL REUS W/TWL LRG LVL3 (GOWN DISPOSABLE) ×4
KIT TURNOVER KIT A (KITS) ×2 IMPLANT
LOOP VESSEL MINI 0.8X406 BLUE (MISCELLANEOUS) ×1 IMPLANT
LOOP VESSEL SUPERMAXI WHITE (MISCELLANEOUS) ×1 IMPLANT
LOOPS BLUE MINI 0.8X406MM (MISCELLANEOUS)
MANIFOLD NEPTUNE II (INSTRUMENTS) ×2 IMPLANT
NDL MAYO CATGUT SZ5 (NEEDLE)
NDL SUT 5 .5 CRC TPR PNT MAYO (NEEDLE) IMPLANT
PACK EXTREMITY ARMC (MISCELLANEOUS) ×2 IMPLANT
PAD CAST CTTN 4X4 STRL (SOFTGOODS) ×1 IMPLANT
PADDING CAST 4IN STRL (MISCELLANEOUS) ×2
PADDING CAST BLEND 4X4 STRL (MISCELLANEOUS) ×2 IMPLANT
PADDING CAST COTTON 4X4 STRL (SOFTGOODS) ×2
SLING ARM LRG DEEP (SOFTGOODS) ×2 IMPLANT
STOCKINETTE IMPERVIOUS 9X36 MD (GAUZE/BANDAGES/DRESSINGS) ×2 IMPLANT
SUT 2 FIBERLOOP 20 STRT BLUE (SUTURE)
SUT LASSO 90 DEG CVD (SUTURE) ×1 IMPLANT
SUT MNCRL AB 4-0 PS2 18 (SUTURE) ×2 IMPLANT
SUT VICRYL 3-0 27IN (SUTURE) ×2 IMPLANT
SUTURE 2 FIBERLOOP 20 STRT BLU (SUTURE) ×1 IMPLANT
SYSTEM ARTHRO FOR DISTAL BICEP (Orthopedic Implant) ×1 IMPLANT
WATER STERILE IRR 500ML POUR (IV SOLUTION) ×1 IMPLANT

## 2021-05-06 NOTE — Transfer of Care (Signed)
Immediate Anesthesia Transfer of Care Note ? ?Patient: Glenn Serrano ? ?Procedure(s) Performed: Left distal biceps repair (Left: Arm Upper) ? ?Patient Location: PACU ? ?Anesthesia Type:General ? ?Level of Consciousness: drowsy ? ?Airway & Oxygen Therapy: Patient Spontanous Breathing and Patient connected to face mask oxygen ? ?Post-op Assessment: Report given to RN ? ?Post vital signs: stable ? ?Last Vitals:  ?Vitals Value Taken Time  ?BP 146/88 05/06/21 1704  ?Temp 36.3 ?C 05/06/21 1704  ?Pulse 87 05/06/21 1707  ?Resp 16 05/06/21 1707  ?SpO2 95 % 05/06/21 1707  ?Vitals shown include unvalidated device data. ? ?Last Pain:  ?Vitals:  ? 05/06/21 1327  ?TempSrc: Oral  ?   ? ?  ? ?Complications: No notable events documented. ?

## 2021-05-06 NOTE — Op Note (Signed)
Operative Note  ?  ?SURGERY DATE: 05/06/2021 ?  ?PRE-OP DIAGNOSIS:  ?1. Left distal biceps rupture ?  ?POST-OP DIAGNOSIS:  ?1. Left distal biceps rupture ? ?PROCEDURES:  ?1. Left distal biceps repair ?  ?SURGEON: Rosealee Albee, MD ?  ?ASSISTANT: none ? ?ANESTHESIA: Gen ?  ?ESTIMATED BLOOD LOSS: 50cc ?  ?TOTAL IV FLUIDS: see anesthesia record ? ?IMPLANTS: ?Arthrex biceps button and 11mm interference screw ?  ?INDICATION(S):  Glenn Serrano is a 41 y.o. male who sustained a distal biceps rupture while moving furniture at his house. Clinical and MRI findings confirmed diagnosis.  After discussion of risks, benefits, and alternatives to surgery, the patient elected to proceed with distal biceps repair. ?  ?OPERATIVE FINDINGS: Distal biceps rupture ?  ?OPERATIVE REPORT:   ?I identified Glenn Serrano in the pre-operative holding area. Informed consent was obtained and the surgical site was marked. I reviewed the risks and benefits of the proposed surgical intervention and the patient wished to proceed. The patient was transferred to the operative suite and general anesthesia was administered. The patient was placed in the supine position with arm on an arm board.  All down side pressure points were appropriately padded. Appropriate IV antibiotics were administered within 30 minutes before incision. The extremity was then prepped and draped in standard fashion. A time out was performed confirming the correct extremity, correct patient, and correct procedure.  ? ?A 6 cm transverse incision was made approximately 4 cm distal to the elbow flexion crease.  Care was taken to carefully dissect down to the fascial layer of the brachioradialis and pronator teres.  The lateral antebrachial cutaneous nerve was not encountered.  The fascial layer was then incised.  Dissection was carried proximally to the biceps muscle and tendon.  The biceps muscle was milked to allow the biceps tendon to be visualized.  The ends of the  tendon were freshened with a 15 blade and tapered to allow for easier passage at the time of fixation.  A fiber loop suture was placed along the distal 20 mm of the tendon.  The tendon was sized to 7 mm.  The tendon was then wrapped in saline gauze and retracted proximally.  Deep dissection was carefully carried out to the level of the radial tuberosity using fluoroscopy for confirmation.  Care was taken to not retract on the lateral and posterior side of the radius to avoid injuring the posterior interosseous nerve.  A guidepin was placed in the center of the radial tuberosity aiming in a approximately 30 degree direction towards the ulna to avoid injuring the posterior interosseous nerve. Position was confirmed with fluoroscopy.  A 58mm reamer was used to drill a unicortical socket over the guidepin.  All bony debris from this was removed to reduce the risk of heterotopic ossification.  The wound was then irrigated.  The suture tails of the fiber loop on the biceps tendon were then passed through the biceps button to allow for a tension slide technique.  The button was then inserted through the radial tuberosity to rest on the far cortex of the radius, and the fiberloop sutures were then pulled to advance the biceps tendon into the socket. Next, using a [employer] driver, the 7 mm interference screw was then placed on the radial side of the socket to push the biceps tendon towards its more anatomical position on the ulnar side of the radial tuberosity. Finally, one limb was again passed through the biceps tendon and a knot  was tied over the interference screw.  The elbow was taken through a range of motion and there was no gapping or movement of the biceps tendon.  Additionally the patient's hook test clinically was now normal. ? ?The wound was again copiously irrigated.  The subdermal layer was closed with 3-0 Vicryl and skin was closed with a running subcuticular 4-0 Monocryl.  Dermabond was applied. Soft,  sterile dressing was applied. A sling was placed. Patient was extubated, transferred to a stretcher bed and to the post anesthesia care unit in stable condition.  ? ? ?POSTOPERATIVE PLAN: ?The patient will be discharged home from PACU. Operative arm to remain in sling until outpatient PT, which will start in 3-4 days. Patient to return to clinic in ~2 weeks for post-operative appointment.   ? ?REHAB PROTOCOL: ?- After 1st PT visit: Progress towards full passive RoM ?- 1 week - Can start active RoM ?- 2 weeks - Can start strengthening with 1 lb weights ?- 3 weeks - ADLs and active motion as tolerated. No more than 5 lb lifting. ?- 4 weeks - can gradually resume most normal activities ie raking leaves, mowing yard, shooting basketball, driving, etc. No more than 10 lb lifting. ?- 6 weeks - Can start lifting heavier weights (~20-30 lbs) ?- 2-3 months - Can lift gym type weights (~80-100 lbs) ? ?

## 2021-05-06 NOTE — Anesthesia Preprocedure Evaluation (Signed)
Anesthesia Evaluation  ?Patient identified by MRN, date of birth, ID band ?Patient awake ? ? ? ?Reviewed: ?Allergy & Precautions, NPO status , Patient's Chart, lab work & pertinent test results ? ?History of Anesthesia Complications ?Negative for: history of anesthetic complications ? ?Airway ?Mallampati: III ? ?TM Distance: >3 FB ?Neck ROM: full ? ? ? Dental ? ?(+) Chipped ?  ?Pulmonary ?neg shortness of breath, Patient abstained from smoking., former smoker,  ?  ?Pulmonary exam normal ? ? ? ? ? ? ? Cardiovascular ?Exercise Tolerance: Good ?(-) angina(-) Past MI and (-) DOE negative cardio ROS ?Normal cardiovascular exam ? ? ?  ?Neuro/Psych ? Headaches, negative psych ROS  ? GI/Hepatic ?negative GI ROS, Neg liver ROS, neg GERD  ,  ?Endo/Other  ?negative endocrine ROS ? Renal/GU ?  ? ?  ?Musculoskeletal ? ? Abdominal ?  ?Peds ? Hematology ?negative hematology ROS ?(+)   ?Anesthesia Other Findings ?Past Medical History: ?No date: Erectile dysfunction ?No date: Hematuria ?No date: History of kidney stones ?No date: Migraine headache ?    Comment:  none in last 2 yrs ? ?Past Surgical History: ?No date: APPENDECTOMY ?09/10/2019: CYSTOSCOPY W/ RETROGRADES; Left ?    Comment:  Procedure: CYSTOSCOPY WITH RETROGRADE PYELOGRAM;   ?             Surgeon: Riki Altes, MD;  Location: [facility];   ?             Service: Urology;  Laterality: Left; ?09/24/2019: CYSTOSCOPY W/ RETROGRADES; Left ?    Comment:  Procedure: CYSTOSCOPY WITH RETROGRADE PYELOGRAM;   ?             Surgeon: Riki Altes, MD;  Location: [facility];   ?             Service: Urology;  Laterality: Left; ?09/10/2019: CYSTOSCOPY WITH STENT PLACEMENT; Left ?    Comment:  Procedure: CYSTOSCOPY WITH STENT PLACEMENT;  Surgeon:  ?             Riki Altes, MD;  Location: [facility];  Service:  ?             Urology;  Laterality: Left; ?09/10/2019: CYSTOSCOPY WITH URETHRAL DILATATION; Left ?    Comment:  Procedure: CYSTOSCOPY WITH  URETHRAL DILATATION;   ?             Surgeon: Riki Altes, MD;  Location: [facility];   ?             Service: Urology;  Laterality: Left; ?09/24/2019: CYSTOSCOPY/URETEROSCOPY/HOLMIUM LASER/STENT PLACEMENT; Left ?    Comment:  Procedure: CYSTOSCOPY/URETEROSCOPY/HOLMIUM LASER/STENT  ?             Exchange;  Surgeon: Riki Altes, MD;  Location: [facility] ?             ORS;  Service: Urology;  Laterality: Left; ?No date: ELBOW SURGERY ?08/16/2018: MYRINGOTOMY WITH TUBE PLACEMENT; Right ?    Comment:  Procedure: MYRINGOTOMY WITH TUBE PLACEMENT;  Surgeon:  ?             Vernie Murders, MD;  Location: [facility];   ?             Service: ENT;  Laterality: Right; ?08/16/2018: NASOPHARYNGOSCOPY EUSTATION TUBE BALLOON DILATION;  ?Bilateral ?    Comment:  Procedure: NASOPHARYNGOSCOPY EUSTATION TUBE BALLOON  ?             DILATION;  Surgeon: Elenore Rota,  Renae Fickle, MD;  [facility]  [facility]             [facility];  Service: ENT;  Laterality: Bilateral; ?No date: TONSILLECTOMY ?08/16/2018: TURBINATE REDUCTION; Bilateral ?    Comment:  Procedure: TURBINATE REDUCTION Outfracture inferior;   ?             Surgeon: Vernie Murders, MD;  Location: [facility]  ?             [facility];  Service: ENT;  Laterality: Bilateral; ?09/10/2019: URETEROSCOPY; Left ?    Comment:  Procedure: URETEROSCOPY;  Surgeon: Riki Altes, MD; ?             Location: [facility];  Service: Urology;  Laterality: Left; ? ?BMI   ? Body Mass Index: 37.31 kg/m?  ?  ? ? Reproductive/Obstetrics ?negative OB ROS ? ?  ? ? ? ? ? ? ? ? ? ? ? ? ? ?  ?  ? ? ? ? ? ? ? ? ?Anesthesia Physical ?Anesthesia Plan ? ?ASA: 3 ? ?Anesthesia Plan: General ETT  ? ?Post-op Pain Management:   ? ?Induction: Intravenous ? ?PONV Risk Score and Plan: Ondansetron, Dexamethasone, Midazolam and Treatment may vary due to age or medical condition ? ?Airway Management Planned: Oral ETT ? ?Additional Equipment:  ? ?Intra-op Plan:  ? ?Post-operative Plan: Extubation in OR ? ?Informed Consent: I have  reviewed the patients History and Physical, chart, labs and discussed the procedure including the risks, benefits and alternatives for the proposed anesthesia with the patient or authorized representative who has indicated his/her understanding and acceptance.  ? ? ? ?Dental Advisory Given ? ?Plan Discussed with: Anesthesiologist, CRNA and Surgeon ? ?Anesthesia Plan Comments: (Patient consented for post op PNB PRN ? ?Patient consented for risks of anesthesia including but not limited to:  ?- adverse reactions to medications ?- damage to eyes, teeth, lips or other oral mucosa ?- nerve damage due to positioning  ?- sore throat or hoarseness ?- Damage to heart, brain, nerves, lungs, other parts of body or loss of life ? ?Patient voiced understanding.)  ? ? ? ? ? ? ?Anesthesia Quick Evaluation ? ?

## 2021-05-06 NOTE — Anesthesia Postprocedure Evaluation (Signed)
Anesthesia Post Note ? ?Patient: Glenn Serrano ? ?Procedure(s) Performed: Left distal biceps repair (Left: Arm Upper) ? ?Patient location during evaluation: PACU ?Anesthesia Type: General ?Level of consciousness: awake and alert, oriented and patient cooperative ?Pain management: pain level controlled ?Vital Signs Assessment: post-procedure vital signs reviewed and stable ?Respiratory status: spontaneous breathing, nonlabored ventilation and respiratory function stable ?Cardiovascular status: blood pressure returned to baseline and stable ?Postop Assessment: adequate PO intake ?Anesthetic complications: no ? ? ?No notable events documented. ? ? ?Last Vitals:  ?Vitals:  ? 05/06/21 1745 05/06/21 1754  ?BP: 134/86 140/80  ?Pulse: 92 85  ?Resp: 14 18  ?Temp:  36.6 ?C  ?SpO2: 95% 95%  ?  ?Last Pain:  ?Vitals:  ? 05/06/21 1754  ?TempSrc: Temporal  ?PainSc:   ? ? ?  ?  ?  ?  ?  ?  ? ?Darrin Nipper ? ? ? ? ?

## 2021-05-06 NOTE — H&P (Signed)
Paper H&P to be scanned into permanent record. H&P reviewed. No significant changes noted.  

## 2021-05-06 NOTE — Discharge Instructions (Addendum)
AMBULATORY SURGERY  ?DISCHARGE INSTRUCTIONS ? ? ?The drugs that you were given will stay in your system until tomorrow so for the next 24 hours you should not: ? ?Drive an automobile ?Make any legal decisions ?Drink any alcoholic beverage ? ? ?You may resume regular meals tomorrow.  Today it is better to start with liquids and gradually work up to solid foods. ? ?You may eat anything you prefer, but it is better to start with liquids, then soup and crackers, and gradually work up to solid foods. ? ? ?Please notify your doctor immediately if you have any unusual bleeding, trouble breathing, redness and pain at the surgery site, drainage, fever, or pain not relieved by medication. ? ? ? ?Additional Instructions: ? ? ? ? ? ? ? ?Please contact your physician with any problems or Same Day Surgery at [phone], Monday through Friday 6 am to 4 pm, or [place] at [facility] number at [phone]. Post-Op Instructions - Distal Biceps Repair ? ?1. Bracing: You will wear a sling for 3-4 days until your first PT appointment. Wean from sling after PT visit. ? ?2. Driving: No driving for at least 2 weeks post-op.  ? ?3. Activity:  ?- After 1st PT visit: Progress towards full passive RoM ?- 1 week - Can start active RoM ?- 2 weeks - Can start strengthening with 1 lb weights ?- 3 weeks - ADLs and active motion as tolerated. No more than 5 lb lifting. ?- 4 weeks - can gradually resume most normal activities ie raking leaves, mowing yard, shooting basketball, driving, etc. No more than 10 lb lifting. ?- 6 weeks - Can start lifting heavier weights (~20-30 lbs) ?- 2-3 months - Can lift gym type weights (~80-100 lbs) ? ?4. Physical Therapy: Begins 3-7 days after surgery ? ?5. Medications:  ?- You will be provided a prescription for narcotic pain medicine. After surgery, take 1-2 narcotic tablets every 4 hours if needed for severe pain.  ?- A prescription for anti-nausea medication will be provided in case the narcotic medicine  causes nausea - take 1 tablet every 6 hours only if nauseated.   ?- Take ASA 325mg /day x 2 weeks to help prevent DVTs/PEs (blood clots).  ?- Take Tylenol 1000mg  (2 extra strength tablets or 3 regular strength tablets) three times/day until your pain improves. This will reduce the amount of narcotic pain medication needed.  ? ?If you are taking prescription medication for anxiety, depression, insomnia, muscle spasm, chronic pain, or for attention deficit disorder, you are advised that you are at a higher risk of adverse effects with use of narcotics post-op, including narcotic addiction/dependence, depressed breathing, death. ?If you use non-prescribed substances: alcohol, marijuana, cocaine, heroin, methamphetamines, etc., you are at a higher risk of adverse effects with use of narcotics post-op, including narcotic addiction/dependence, depressed breathing, death. ?You are advised that taking > 50 morphine milligram equivalents (MME) of narcotic pain medication per day results in twice the risk of overdose or death. For your prescription provided: hydrocodone 5 mg - taking more than 8 tablets per day would result in > 50 morphine milligram equivalents (MME) of narcotic pain medication. ?Be advised that we will prescribe narcotics short-term, for acute post-operative pain only - 3 weeks for major operations such as shoulder repair/reconstruction surgeries.  ? ?6. Post-Op Appointment: ? ?Your first post-op appointment will be 10-14 days post-op. ? ?7. Work or School: For most, but not all procedures, we advise staying out of work or school for at  least 1 to 2 weeks in order to recover from the stress of surgery and to allow time for healing.  ? ?If you need a work or school note this can be provided.  ? ?8. Smoking: If you are a smoker, you need to refrain from smoking in the postoperative period. The nicotine in cigarettes will inhibit healing of your shoulder repair and decrease the chance of successful repair.  Similarly, nicotine containing products (gum, patches) should be avoided.  ? ? ?

## 2021-05-06 NOTE — Anesthesia Procedure Notes (Signed)
Procedure Name: Intubation ?Date/Time: 05/06/2021 2:52 PM ?Performed by: Johnna Acosta, CRNA ?Pre-anesthesia Checklist: Patient identified, Emergency Drugs available, Suction available, Patient being monitored and Timeout performed ?Patient Re-evaluated:Patient Re-evaluated prior to induction ?Oxygen Delivery Method: Circle system utilized ?Preoxygenation: Pre-oxygenation with 100% oxygen ?Induction Type: IV induction and Rapid sequence ?Laryngoscope Size: McGraph and 3 ?Grade View: Grade I ?Tube type: Oral ?Tube size: 7.5 mm ?Number of attempts: 1 ?Airway Equipment and Method: Stylet and Video-laryngoscopy ?Placement Confirmation: ETT inserted through vocal cords under direct vision, positive ETCO2 and breath sounds checked- equal and bilateral ?Secured at: 23 cm ?Dental Injury: Teeth and Oropharynx as per pre-operative assessment  ?Difficulty Due To: Difficulty was anticipated and Difficult Airway- due to large tongue ? ? ? ? ?

## 2021-05-07 ENCOUNTER — Encounter: Payer: Self-pay | Admitting: Orthopedic Surgery

## 2021-08-10 ENCOUNTER — Other Ambulatory Visit: Payer: Self-pay | Admitting: Orthopedic Surgery

## 2021-08-10 DIAGNOSIS — M25522 Pain in left elbow: Secondary | ICD-10-CM

## 2021-08-27 ENCOUNTER — Ambulatory Visit
Admission: RE | Admit: 2021-08-27 | Discharge: 2021-08-27 | Disposition: A | Discharge status: Home / Self Care | Payer: Managed Care, Other (non HMO) | Source: Ambulatory Visit | Attending: Orthopedic Surgery

## 2021-08-27 DIAGNOSIS — M25522 Pain in left elbow: Secondary | ICD-10-CM

## 2021-09-02 ENCOUNTER — Other Ambulatory Visit: Payer: Self-pay | Admitting: Orthopedic Surgery

## 2021-09-06 ENCOUNTER — Encounter: Payer: Self-pay | Admitting: Orthopedic Surgery

## 2021-09-09 ENCOUNTER — Encounter: Payer: Self-pay | Admitting: Orthopedic Surgery

## 2021-09-09 ENCOUNTER — Other Ambulatory Visit: Payer: Self-pay

## 2021-09-09 ENCOUNTER — Ambulatory Visit
Admission: RE | Admit: 2021-09-09 | Discharge: 2021-09-09 | Disposition: A | Discharge status: Home / Self Care | Payer: Managed Care, Other (non HMO) | Attending: Orthopedic Surgery | Admitting: Orthopedic Surgery

## 2021-09-09 ENCOUNTER — Ambulatory Visit: Payer: Managed Care, Other (non HMO) | Admitting: General Practice

## 2021-09-09 ENCOUNTER — Ambulatory Visit: Payer: Self-pay

## 2021-09-09 ENCOUNTER — Ambulatory Visit (AMBULATORY_SURGERY_CENTER): Payer: Managed Care, Other (non HMO) | Admitting: General Practice

## 2021-09-09 ENCOUNTER — Encounter
Admission: RE | Disposition: A | Discharge status: Home / Self Care | Payer: Self-pay | Source: Home / Self Care | Attending: Orthopedic Surgery

## 2021-09-09 DIAGNOSIS — Z87891 Personal history of nicotine dependence: Secondary | ICD-10-CM | POA: Insufficient documentation

## 2021-09-09 DIAGNOSIS — M85622 Other cyst of bone, left upper arm: Secondary | ICD-10-CM | POA: Diagnosis not present

## 2021-09-09 DIAGNOSIS — M25522 Pain in left elbow: Secondary | ICD-10-CM

## 2021-09-09 DIAGNOSIS — G43909 Migraine, unspecified, not intractable, without status migrainosus: Secondary | ICD-10-CM | POA: Diagnosis not present

## 2021-09-09 DIAGNOSIS — Z6837 Body mass index (BMI) 37.0-37.9, adult: Secondary | ICD-10-CM | POA: Insufficient documentation

## 2021-09-09 DIAGNOSIS — E669 Obesity, unspecified: Secondary | ICD-10-CM | POA: Diagnosis not present

## 2021-09-09 HISTORY — PX: EXCISION MASS UPPER EXTREMETIES: SHX6704

## 2021-09-09 SURGERY — EXCISION MASS UPPER EXTREMITIES
Anesthesia: General | Site: Elbow | Laterality: Left

## 2021-09-09 MED ORDER — CEFAZOLIN SODIUM-DEXTROSE 2-4 GM/100ML-% IV SOLN
2.0000 g | Freq: Once | INTRAVENOUS | Status: AC
  Administered 2021-09-09 (×2): 2 g via INTRAVENOUS

## 2021-09-09 MED ORDER — ACETAMINOPHEN 10 MG/ML IV SOLN: 1000.0000 mg | Freq: Once | INTRAVENOUS | Status: DC | PRN

## 2021-09-09 MED ORDER — ONDANSETRON HCL 4 MG/2ML IJ SOLN
INTRAMUSCULAR | Status: DC | PRN
  Administered 2021-09-09: 4 mg via INTRAVENOUS

## 2021-09-09 MED ORDER — KETAMINE HCL 10 MG/ML IJ SOLN
INTRAMUSCULAR | Status: DC | PRN
  Administered 2021-09-09: 30 mg via INTRAVENOUS

## 2021-09-09 MED ORDER — DEXAMETHASONE SODIUM PHOSPHATE 10 MG/ML IJ SOLN
INTRAMUSCULAR | Status: DC | PRN
  Administered 2021-09-09: 10 mg via INTRAVENOUS

## 2021-09-09 MED ORDER — LIDOCAINE HCL (CARDIAC) PF 100 MG/5ML IV SOSY
PREFILLED_SYRINGE | INTRAVENOUS | Status: DC | PRN
  Administered 2021-09-09: 100 mg via INTRAVENOUS

## 2021-09-09 MED ORDER — PANTOPRAZOLE SODIUM 40 MG PO TBEC: 40.0000 mg | DELAYED_RELEASE_TABLET | Freq: Every day | ORAL | 1 refills | Status: DC

## 2021-09-09 MED ORDER — INDOMETHACIN 25 MG PO CAPS: 25.0000 mg | ORAL_CAPSULE | Freq: Three times a day (TID) | ORAL | 0 refills | Status: AC

## 2021-09-09 MED ORDER — PROMETHAZINE HCL 25 MG/ML IJ SOLN: 6.2500 mg | INTRAMUSCULAR | Status: DC | PRN

## 2021-09-09 MED ORDER — MIDAZOLAM HCL 2 MG/2ML IJ SOLN
INTRAMUSCULAR | Status: DC | PRN
  Administered 2021-09-09: 2 mg via INTRAVENOUS

## 2021-09-09 MED ORDER — FENTANYL CITRATE (PF) 100 MCG/2ML IJ SOLN
INTRAMUSCULAR | Status: DC | PRN
  Administered 2021-09-09: 100 ug via INTRAVENOUS

## 2021-09-09 MED ORDER — KETOROLAC TROMETHAMINE 30 MG/ML IJ SOLN
INTRAMUSCULAR | Status: DC | PRN
  Administered 2021-09-09: 15 mg via INTRAVENOUS

## 2021-09-09 MED ORDER — OXYCODONE HCL 5 MG/5ML PO SOLN: 5.0000 mg | Freq: Once | ORAL | Status: AC | PRN

## 2021-09-09 MED ORDER — VANCOMYCIN HCL 1500 MG/300ML IV SOLN: 1500.0000 mg | INTRAVENOUS | Status: DC

## 2021-09-09 MED ORDER — FENTANYL CITRATE PF 50 MCG/ML IJ SOSY: 25.0000 ug | PREFILLED_SYRINGE | INTRAMUSCULAR | Status: DC | PRN

## 2021-09-09 MED ORDER — ACETAMINOPHEN 500 MG PO TABS
1000.0000 mg | ORAL_TABLET | Freq: Once | ORAL | Status: AC
  Administered 2021-09-09: 1000 mg via ORAL

## 2021-09-09 MED ORDER — LACTATED RINGERS IV SOLN: INTRAVENOUS | Status: DC

## 2021-09-09 MED ORDER — PROPOFOL 10 MG/ML IV BOLUS
INTRAVENOUS | Status: DC | PRN
  Administered 2021-09-09: 200 mg via INTRAVENOUS
  Administered 2021-09-09: 60 mg via INTRAVENOUS
  Administered 2021-09-09: 100 mg via INTRAVENOUS

## 2021-09-09 MED ORDER — OXYCODONE HCL 5 MG PO TABS
10.0000 mg | ORAL_TABLET | Freq: Once | ORAL | Status: AC | PRN
  Administered 2021-09-09: 10 mg via ORAL

## 2021-09-09 MED ORDER — 0.9 % SODIUM CHLORIDE (POUR BTL) OPTIME
TOPICAL | Status: DC | PRN
  Administered 2021-09-09: 500 mL

## 2021-09-09 MED ORDER — ONDANSETRON 4 MG PO TBDP: 4.0000 mg | ORAL_TABLET | Freq: Three times a day (TID) | ORAL | 0 refills | Status: DC | PRN

## 2021-09-09 MED ORDER — DROPERIDOL 2.5 MG/ML IJ SOLN: 0.6250 mg | Freq: Once | INTRAMUSCULAR | Status: DC | PRN

## 2021-09-09 SURGICAL SUPPLY — 48 items
ADH SKN CLS APL DERMABOND .7 (GAUZE/BANDAGES/DRESSINGS) ×1
APL PRP STRL LF DISP 70% ISPRP (MISCELLANEOUS) ×2
BNDG CMPR STD VLCR NS LF 5.8X4 (GAUZE/BANDAGES/DRESSINGS) ×2
BNDG ELASTIC 4X5.8 VLCR NS LF (GAUZE/BANDAGES/DRESSINGS) ×2 IMPLANT
BNDG ESMARK 4X12 TAN STRL LF (GAUZE/BANDAGES/DRESSINGS) ×1 IMPLANT
CANISTER SUCT 1200ML W/VALVE (MISCELLANEOUS) ×1 IMPLANT
CHLORAPREP W/TINT 26 (MISCELLANEOUS) ×2 IMPLANT
CORD BIP STRL DISP 12FT (MISCELLANEOUS) IMPLANT
COVER LIGHT HANDLE FLEXIBLE (MISCELLANEOUS) ×2 IMPLANT
COVER WAND RF STERILE (DRAPES) ×1 IMPLANT
CUFF TOURN SGL QUICK 18X4 (TOURNIQUET CUFF) ×1 IMPLANT
CUFF TOURN SGL QUICK 24 (TOURNIQUET CUFF) ×1
CUFF TRNQT CYL 24X4X16.5-23 (TOURNIQUET CUFF) ×1 IMPLANT
DERMABOND ADVANCED (GAUZE/BANDAGES/DRESSINGS) ×1
DERMABOND ADVANCED .7 DNX12 (GAUZE/BANDAGES/DRESSINGS) IMPLANT
DRAPE FLUOR MINI C-ARM 54X84 (DRAPES) IMPLANT
DRAPE SHEET LG 3/4 BI-LAMINATE (DRAPES) ×1 IMPLANT
DRAPE U-SHAPE 48X52 POLY STRL (PACKS) IMPLANT
ELECT CAUTERY BLADE TIP 2.5 (TIP) ×1
ELECT REM PT RETURN 9FT ADLT (ELECTROSURGICAL) ×1
ELECTRODE CAUTERY BLDE TIP 2.5 (TIP) ×1 IMPLANT
ELECTRODE REM PT RTRN 9FT ADLT (ELECTROSURGICAL) ×1 IMPLANT
GAUZE SPONGE 4X4 12PLY STRL (GAUZE/BANDAGES/DRESSINGS) ×1 IMPLANT
GAUZE XEROFORM 1X8 LF (GAUZE/BANDAGES/DRESSINGS) ×1 IMPLANT
GLOVE SRG 8 PF TXTR STRL LF DI (GLOVE) ×1 IMPLANT
GLOVE SURG ENC MOIS LTX SZ7.5 (GLOVE) ×1 IMPLANT
GLOVE SURG UNDER POLY LF SZ8 (GLOVE) ×1
GOWN STRL REIN 2XL XLG LVL4 (GOWN DISPOSABLE) ×1 IMPLANT
GOWN STRL REUS W/ TWL LRG LVL3 (GOWN DISPOSABLE) ×1 IMPLANT
GOWN STRL REUS W/TWL LRG LVL3 (GOWN DISPOSABLE) ×1
KIT TURNOVER KIT A (KITS) ×1 IMPLANT
NS IRRIG 500ML POUR BTL (IV SOLUTION) ×1 IMPLANT
PACK EXTREMITY ARMC (MISCELLANEOUS) ×1 IMPLANT
PAD ABD DERMACEA PRESS 5X9 (GAUZE/BANDAGES/DRESSINGS) ×1 IMPLANT
PAD CAST CTTN 4X4 STRL (SOFTGOODS) ×3 IMPLANT
PADDING CAST COTTON 4X4 STRL (SOFTGOODS) ×3
PENCIL SMOKE EVACUATOR (MISCELLANEOUS) ×1 IMPLANT
SLEEVE SUCTION 125 (MISCELLANEOUS) ×1 IMPLANT
SLING ARM LRG DEEP (SOFTGOODS) IMPLANT
SPLINT CAST 1 STEP 5X30 WHT (MISCELLANEOUS) IMPLANT
SPONGE LAP 18X18 RF (DISPOSABLE) ×2 IMPLANT
STAPLER SKIN PROX 35W (STAPLE) ×1 IMPLANT
STOCKINETTE IMPERVIOUS 9X36 MD (GAUZE/BANDAGES/DRESSINGS) IMPLANT
SUT MNCRL 4-0 (SUTURE) ×1
SUT MNCRL 4-0 27XMFL (SUTURE) ×1
SUT VIC AB 3-0 SH 27 (SUTURE) ×1
SUT VIC AB 3-0 SH 27X BRD (SUTURE) IMPLANT
SUTURE MNCRL 4-0 27XMF (SUTURE) IMPLANT

## 2021-09-09 NOTE — Transfer of Care (Signed)
Immediate Anesthesia Transfer of Care Note  Patient: Glenn Serrano  Procedure(s) Performed: Left elbow heterotopic ossification excision (Left: Elbow)  Patient Location: PACU  Anesthesia Type:General  Level of Consciousness: awake, alert  and oriented  Airway & Oxygen Therapy: Patient Spontanous Breathing and Patient connected to nasal cannula oxygen  Post-op Assessment: Report given to RN, Post -op Vital signs reviewed and stable and Patient moving all extremities  Post vital signs: Reviewed and stable  Last Vitals:  Vitals Value Taken Time  BP 158/87 09/09/21 1447  Temp    Pulse 75 09/09/21 1449  Resp 17 09/09/21 1449  SpO2 97 % 09/09/21 1449  Vitals shown include unvalidated device data.  Last Pain:  Vitals:   09/09/21 1054  TempSrc: Temporal  PainSc: 0-No pain         Complications: No notable events documented.

## 2021-09-09 NOTE — Anesthesia Procedure Notes (Signed)
Procedure Name: LMA Insertion Date/Time: 09/09/2021 12:38 PM  Performed by: Katherine Basset, CRNAPre-anesthesia Checklist: Patient identified, Emergency Drugs available, Suction available and Patient being monitored Patient Re-evaluated:Patient Re-evaluated prior to induction Oxygen Delivery Method: Circle system utilized Preoxygenation: Pre-oxygenation with 100% oxygen Induction Type: IV induction Ventilation: Mask ventilation without difficulty and Oral airway inserted - appropriate to patient size LMA: LMA flexible inserted LMA Size: 5.0 Number of attempts: 1 Airway Equipment and Method: Oral airway Placement Confirmation: positive ETCO2 and breath sounds checked- equal and bilateral Tube secured with: Tape Dental Injury: Teeth and Oropharynx as per pre-operative assessment

## 2021-09-09 NOTE — Op Note (Addendum)
Operative Note    SURGERY DATE: 09/09/2021    PRE-OP DIAGNOSIS:  1. Left elbow heterotopic ossification   POST-OP DIAGNOSIS:  1. Left elbow heterotopic ossification   PROCEDURES:  1. Left elbow excision of heterotopic ossification mass   SURGEON: Rosealee Albee, MD   ASSISTANT: Sonny Dandy, PA    ANESTHESIA: Gen   ESTIMATED BLOOD LOSS: 5cc   TOTAL IV FLUIDS: see anesthesia record    INDICATION(S):  Glenn Serrano is a 41 y.o. male who underwent single incision distal biceps repair by me on 05/06/2021.  He did well for approximately 3 weeks postoperatively before he started to develop increased pain with maximum supination.  Radiographs showed early signs of heterotopic ossification.  This continue to progress despite indomethacin dosing.  Patient underwent serial radiographs and then a CT scan until heterotopic ossification was deemed to be matured.  Patient had continued symptoms of pain throughout this time.  After discussion of risks, benefits, and alternatives to surgery, the patient elected to proceed with surgery to remove heterotopic ossification.   OPERATIVE FINDINGS: Multiple bony fragments consistent with heterotopic ossification   OPERATIVE REPORT:   I identified Vineeth Fell in the pre-operative holding area. Informed consent was obtained and the surgical site was marked. I reviewed the risks and benefits of the proposed surgical intervention and the patient wished to proceed. The patient was transferred to the operative suite and general anesthesia was administered. The patient was placed in the supine position with arm on an arm board.  All down side pressure points were appropriately padded. Appropriate IV antibiotics were administered within 30 minutes before incision. The extremity was then prepped and draped in standard fashion. A time out was performed confirming the correct extremity, correct patient, and correct procedure.    I utilized the prior  transverse incision approximately 4 cm distal to the elbow flexion crease.  Care was taken to carefully dissect down to the fascial layer of the brachioradialis and pronator teres.  The lateral antebrachial cutaneous nerve was not encountered.  The fascial layer was then bluntly dissected.  Dissection was carried down to the biceps insertion on the proximal radius.  Care was taken to not retract on the lateral and posterior side of the radius to avoid injuring the posterior interosseous nerve.  The biceps tendon was noted to be intact.  The primary piece of heterotopic ossification was easily identified with palpation and confirmed with fluoroscopy.  It was circumferentially dissected carefully using combination of bipolar electrocautery and Metzenbaum scissors.  It was removed in 2 pieces.  There was also a spike of bone just distal to the previously drilled tunnel in the radial tuberosity.  This was also smoothen.  Furthermore there was 1 more bony fragment slightly proximal to the tunnel.  This was also circumferentially dissected and removed.  The field of dissection was carefully palpated and no further bony pieces were palpated.  Fluoroscopy was used to confirm that there was no significant heterotopic ossification remaining.  The wound was then thoroughly irrigated.  The subdermal layer was closed with 3-0 Vicryl and skin was closed with a running subcuticular 4-0 Monocryl.  Dermabond was applied. Soft, sterile dressing was applied. A sling was placed. Patient was extubated, transferred to a stretcher bed and to the post anesthesia care unit in stable condition.    Of note, assistance from a PA was essential to performing the surgery.  PA was present for the entire surgery.  PA assisted with  patient positioning, retraction, instrumentation, and wound closure. The surgery would have been more difficult and had longer operative time without PA assistance.     POSTOPERATIVE PLAN: The patient will be  discharged home from PACU.  Sling for comfort.  May start outpatient PT in 3-4 days. Patient to return to clinic in ~2 weeks for post-operative appointment.

## 2021-09-09 NOTE — Discharge Instructions (Addendum)
Elbow Surgery Post-Op Instructions   1. Dressing: He will have a soft dressing on her arm after surgery.  Keep clean and dry for at least 4 days.  Dressing may be removed at first physical therapy appointment.   2. Driving:  Plan on not driving for at least 1 week. Please note that you are advised NOT to drive while taking narcotic pain medications as you may be impaired and unsafe to drive.   3. Activity: Weight bearing: May weight-bear as tolerated.  Use sling for comfort   4. Medications:  -Take narcotic medications as needed.  You already have a prescription of oxycodone on file so I am not prescribing a new narcotic prescription.  Please call if you need a refill. - A prescription for anti-nausea medication will be provided in case the narcotic medicine causes nausea - take 1 tablet every 6 hours only if nauseated.  -Take tylenol 1000 mg every 8 hours for pain.  May stop tylenol 5 days after surgery if you are having minimal pain. -Take indomethacin 25 mg 3 times daily with food.  Please stop taking if this causes GI irritation. Do not take if you have known kidney disease. -Take Protonix (pantoprazole) to protect stomach while taking indomethacin.   If you are taking prescription medication for anxiety, depression, insomnia, muscle spasm, chronic pain, or for attention deficit disorder you are advised that you are at a higher risk of adverse effects with use of narcotics post-op, including narcotic addiction/dependence, depressed breathing, death. If you use non-prescribed substances: alcohol, marijuana, cocaine, heroin, methamphetamines, etc., you are at a higher risk of adverse effects with use of narcotics post-op, including narcotic addiction/dependence, depressed breathing, death. You are advised that taking > 50 morphine milligram equivalents (MME) of narcotic pain medication per day results in twice the risk of overdose or death. For your prescription provided: oxycodone 5 mg - taking  more than 6 tablets per day. Be advised that we will prescribe narcotics short-term, for acute post-operative pain only.   6. Physical Therapy: May start PT ~3-5 days after surgery.     7. Work/School: May do light duty/desk job or return to school in approximately 1-2 weeks when off of narcotics, pain is well-controlled, and swelling has decreased. May not return to full work for at least 2 weeks, possibly up to 6 weeks.    8. Post-Op Appointments: Your first post-op appointment will be with Dr. Allena Katz in approximately 2 weeks time.  If you find that they have not been scheduled please call the Orthopaedic Appointment front desk at [phone].

## 2021-09-09 NOTE — Anesthesia Postprocedure Evaluation (Signed)
Anesthesia Post Note  Patient: Glenn Serrano  Procedure(s) Performed: Left elbow heterotopic ossification excision (Left: Elbow)     Patient location during evaluation: PACU Anesthesia Type: General Level of consciousness: awake and alert Pain management: pain level controlled Vital Signs Assessment: post-procedure vital signs reviewed and stable Respiratory status: spontaneous breathing, nonlabored ventilation and respiratory function stable Cardiovascular status: blood pressure returned to baseline and stable Postop Assessment: no apparent nausea or vomiting Anesthetic complications: no   No notable events documented.  Foye Deer

## 2021-09-09 NOTE — Anesthesia Preprocedure Evaluation (Addendum)
Anesthesia Evaluation  Patient identified by MRN, date of birth, ID band Patient awake    Reviewed: Allergy & Precautions, NPO status , Patient's Chart, lab work & pertinent test results  History of Anesthesia Complications Negative for: history of anesthetic complications  Airway Mallampati: II  TM Distance: >3 FB Neck ROM: full    Dental  (+) Chipped,    Pulmonary neg shortness of breath, Patient abstained from smoking., former smoker,    Pulmonary exam normal        Cardiovascular Exercise Tolerance: Good (-) angina(-) Past MI and (-) DOE negative cardio ROS Normal cardiovascular exam     Neuro/Psych  Headaches, Reports sensitivity in left forearm with intermittent sharp pains form proximal forearm to wrist. No obvious neurodeficits illicited.   Chronic opioid use of 10mg /day negative psych ROS   GI/Hepatic negative GI ROS, Neg liver ROS, neg GERD  ,  Endo/Other  negative endocrine ROS  Renal/GU      Musculoskeletal   Abdominal (+) + obese,   Peds  Hematology negative hematology ROS (+)   Anesthesia Other Findings Past Medical History: No date: Erectile dysfunction No date: Hematuria No date: History of kidney stones No date: Migraine headache     Comment:  none in last 2 yrs  Past Surgical History: No date: APPENDECTOMY 09/10/2019: CYSTOSCOPY W/ RETROGRADES; Left     Comment:  Procedure: CYSTOSCOPY WITH RETROGRADE PYELOGRAM;                Surgeon: 09/12/2019, MD;  Location: [facility];                Service: Urology;  Laterality: Left; 09/24/2019: CYSTOSCOPY W/ RETROGRADES; Left     Comment:  Procedure: CYSTOSCOPY WITH RETROGRADE PYELOGRAM;                Surgeon: 11/24/2019, MD;  Location: [facility];                Service: Urology;  Laterality: Left; 09/10/2019: CYSTOSCOPY WITH STENT PLACEMENT; Left     Comment:  Procedure: CYSTOSCOPY WITH STENT PLACEMENT;  Surgeon:               09/12/2019, MD;  Location: [facility];  Service:               Urology;  Laterality: Left; 09/10/2019: CYSTOSCOPY WITH URETHRAL DILATATION; Left     Comment:  Procedure: CYSTOSCOPY WITH URETHRAL DILATATION;                Surgeon: 09/12/2019, MD;  Location: [facility];                Service: Urology;  Laterality: Left; 09/24/2019: CYSTOSCOPY/URETEROSCOPY/HOLMIUM LASER/STENT PLACEMENT; Left     Comment:  Procedure: CYSTOSCOPY/URETEROSCOPY/HOLMIUM LASER/STENT               Exchange;  Surgeon: 11/24/2019, MD;  Location: [facility]              ORS;  Service: Urology;  Laterality: Left; No date: ELBOW SURGERY 08/16/2018: MYRINGOTOMY WITH TUBE PLACEMENT; Right     Comment:  Procedure: MYRINGOTOMY WITH TUBE PLACEMENT;  Surgeon:               08/18/2018, MD;  Location: [facility];                Service: ENT;  Laterality: Right; 08/16/2018: NASOPHARYNGOSCOPY EUSTATION TUBE BALLOON DILATION;  Bilateral  Comment:  Procedure: NASOPHARYNGOSCOPY EUSTATION TUBE BALLOON               DILATION;  Surgeon: Vernie Murders, MD;  Location: [facility]               SURGERY CNTR;  Service: ENT;  Laterality: Bilateral; No date: TONSILLECTOMY 08/16/2018: TURBINATE REDUCTION; Bilateral     Comment:  Procedure: TURBINATE REDUCTION Outfracture inferior;                Surgeon: Vernie Murders, MD;  Location: [facility] SURGERY               CNTR;  Service: ENT;  Laterality: Bilateral; 09/10/2019: URETEROSCOPY; Left     Comment:  Procedure: URETEROSCOPY;  Surgeon: Riki Altes, MD;              Location: [facility] OR[facility];  Service: Urology;  Laterality: Left;  BMI    Body Mass Index: 37.31 kg/m      Reproductive/Obstetrics negative OB ROS                            Anesthesia Physical  Anesthesia Plan  ASA: 2  Anesthesia Plan: General   Post-op Pain Management: Tylenol PO (pre-op), Fentanyl IV, Oxycodone PO and Toradol IV (intra-op)   Induction: Intravenous  PONV Risk Score and  Plan: Ondansetron, Dexamethasone, Midazolam and Treatment may vary due to age or medical condition  Airway Management Planned: LMA  Additional Equipment:   Intra-op Plan:   Post-operative Plan: Extubation in OR  Informed Consent: I have reviewed the patients History and Physical, chart, labs and discussed the procedure including the risks, benefits and alternatives for the proposed anesthesia with the patient or authorized representative who has indicated his/her understanding and acceptance.     Dental Advisory Given  Plan Discussed with: Anesthesiologist, CRNA and Surgeon  Anesthesia Plan Comments:       Anesthesia Quick Evaluation

## 2021-09-09 NOTE — H&P (Signed)
Paper H&P to be scanned into permanent record. H&P reviewed. No significant changes noted.  

## 2021-09-10 ENCOUNTER — Encounter: Payer: Self-pay | Admitting: Orthopedic Surgery

## 2021-09-13 LAB — SURGICAL PATHOLOGY

## 2021-12-26 ENCOUNTER — Ambulatory Visit
Admission: EM | Admit: 2021-12-26 | Discharge: 2021-12-26 | Disposition: A | Discharge status: Home / Self Care | Payer: Managed Care, Other (non HMO) | Attending: Family Medicine | Admitting: Family Medicine

## 2021-12-26 ENCOUNTER — Telehealth: Payer: Self-pay | Admitting: Emergency Medicine

## 2021-12-26 DIAGNOSIS — H60502 Unspecified acute noninfective otitis externa, left ear: Secondary | ICD-10-CM | POA: Diagnosis not present

## 2021-12-26 MED ORDER — CIPROFLOXACIN-DEXAMETHASONE 0.3-0.1 % OT SUSP: 4.0000 [drp] | Freq: Two times a day (BID) | OTIC | 0 refills | Status: AC

## 2021-12-26 MED ORDER — CIPROFLOXACIN-DEXAMETHASONE 0.3-0.1 % OT SUSP: 4.0000 [drp] | Freq: Two times a day (BID) | OTIC | 0 refills | Status: DC

## 2021-12-26 NOTE — ED Provider Notes (Signed)
[facility]    CSN: [id number] Arrival date & time: 12/26/21  4098      History   Chief Complaint Chief Complaint  Patient presents with   Ear Pain    HPI  41 year old male presents for evaluation of the above.  Patient reports a 3 to 4-day history of left ear pain.  He has a history of recurrent ear infections.  He has been taking Tylenol and ibuprofen without relief in his pain.  No current respiratory symptoms.  No reports of drainage from the ear.  No other associated symptoms.  No other complaints.  Past Medical History:  Diagnosis Date   Erectile dysfunction    Hematuria    History of kidney stones    Migraine headache    none in last 2 yrs    Patient Active Problem List   Diagnosis Date Noted   Smoker 06/01/2015   Ureteral stone 07/02/2014   Liver lesion 07/02/2014    Past Surgical History:  Procedure Laterality Date   APPENDECTOMY     CYSTOSCOPY W/ RETROGRADES Left 09/10/2019   Procedure: CYSTOSCOPY WITH RETROGRADE PYELOGRAM;  Surgeon: Riki Altes, MD;  Location: [facility];  Service: Urology;  Laterality: Left;   CYSTOSCOPY W/ RETROGRADES Left 09/24/2019   Procedure: CYSTOSCOPY WITH RETROGRADE PYELOGRAM;  Surgeon: Riki Altes, MD;  Location: [facility];  Service: Urology;  Laterality: Left;   CYSTOSCOPY WITH STENT PLACEMENT Left 09/10/2019   Procedure: CYSTOSCOPY WITH STENT PLACEMENT;  Surgeon: Riki Altes, MD;  Location: [facility];  Service: Urology;  Laterality: Left;   CYSTOSCOPY WITH URETHRAL DILATATION Left 09/10/2019   Procedure: CYSTOSCOPY WITH URETHRAL DILATATION;  Surgeon: Riki Altes, MD;  Location: [facility];  Service: Urology;  Laterality: Left;   CYSTOSCOPY/URETEROSCOPY/HOLMIUM LASER/STENT PLACEMENT Left 09/24/2019   Procedure: CYSTOSCOPY/URETEROSCOPY/HOLMIUM LASER/STENT Exchange;  Surgeon: Riki Altes, MD;  Location: [facility];  Service: Urology;  Laterality: Left;   DISTAL BICEPS TENDON REPAIR Left 05/06/2021    Procedure: Left distal biceps repair;  Surgeon: Signa Kell, MD;  Location: [facility];  Service: Orthopedics;  Laterality: Left;   ELBOW SURGERY     EXCISION MASS UPPER EXTREMETIES Left 09/09/2021   Procedure: Left elbow heterotopic ossification excision;  Surgeon: Signa Kell, MD;  Location: [facility];  Service: Orthopedics;  Laterality: Left;   MYRINGOTOMY WITH TUBE PLACEMENT Right 08/16/2018   Procedure: MYRINGOTOMY WITH TUBE PLACEMENT;  Surgeon: Vernie Murders, MD;  Location: [facility];  Service: ENT;  Laterality: Right;   NASOPHARYNGOSCOPY EUSTATION TUBE BALLOON DILATION Bilateral 08/16/2018   Procedure: NASOPHARYNGOSCOPY EUSTATION TUBE BALLOON DILATION;  Surgeon: Vernie Murders, MD;  Location: [facility];  Service: ENT;  Laterality: Bilateral;   TONSILLECTOMY     TURBINATE REDUCTION Bilateral 08/16/2018   Procedure: TURBINATE REDUCTION Outfracture inferior;  Surgeon: Vernie Murders, MD;  Location: [facility];  Service: ENT;  Laterality: Bilateral;   URETEROSCOPY Left 09/10/2019   Procedure: URETEROSCOPY;  Surgeon: Riki Altes, MD;  Location: [facility];  Service: Urology;  Laterality: Left;       Home Medications    Prior to Admission medications   Medication Sig Start Date End Date Taking? Authorizing Provider  ciprofloxacin-dexamethasone (CIPRODEX) OTIC suspension Place 4 drops into the left ear 2 (two) times daily for 7 days. 12/26/21 01/02/22 Yes Tommie Sams, DO    Family History Family History  Problem Relation Age of Onset   Hematuria Other        paternal  Kidney cancer Other        paternal   Prostate cancer Other        paternal   Kidney failure Other        paternal    Social History Social History   Tobacco Use   Smoking status: Former    Packs/day: 1.00    Years: 16.00    Total pack years: 16.00    Types: Cigarettes, Cigars    Quit date: 07/18/2015    Years since quitting: 6.4   Smokeless tobacco: Former   Tobacco  comments:    July 2017 switched from cigarettes to vaping  Vaping Use   Vaping Use: Every day   Start date: 07/18/2015   Substances: Nicotine, Flavoring, Nicotine-salt  Substance Use Topics   Alcohol use: Yes    Alcohol/week: 3.0 - 4.0 standard drinks of alcohol    Types: 3 - 4 Cans of beer per week    Comment: occasional - 1x/month   Drug use: No     Allergies   Amoxicillin and Penicillins   Review of Systems Review of Systems Per HPI  Physical Exam Triage Vital Signs ED Triage Vitals  Enc Vitals Group     BP 12/26/21 0845 122/79     Pulse Rate 12/26/21 0845 68     Resp 12/26/21 0845 18     Temp 12/26/21 0845 98.6 F (37 C)     Temp Source 12/26/21 0845 Oral     SpO2 12/26/21 0845 96 %     Weight 12/26/21 0844 270 lb (122.5 kg)     Height 12/26/21 0844 5\' 10"  (1.778 m)     Head Circumference --      Peak Flow --      Pain Score 12/26/21 0843 4     Pain Loc --      Pain Edu? --      Excl. in GC? --    Updated Vital Signs BP 122/79 (BP Location: Left Arm)   Pulse 68   Temp 98.6 F (37 C) (Oral)   Resp 18   Ht 5\' 10"  (1.778 m)   Wt 122.5 kg   SpO2 96%   BMI 38.74 kg/m   Visual Acuity Right Eye Distance:   Left Eye Distance:   Bilateral Distance:    Right Eye Near:   Left Eye Near:    Bilateral Near:     Physical Exam Constitutional:      Appearance: Normal appearance. He is obese.  HENT:     Head: Normocephalic and atraumatic.     Ears:     Comments: Left ear with tragal tenderness.  Diffuse swelling of the canal with extensive debris. Right ear with cerumen and debris as well. Neurological:     Mental Status: He is alert.    UC Treatments / Results  Labs (all labs ordered are listed, but only abnormal results are displayed) Labs Reviewed - No data to display  EKG   Radiology No results found.  Procedures Procedures (including critical care time)  Medications Ordered in UC Medications - No data to display  Initial Impression /  Assessment and Plan / UC Course  I have reviewed the triage vital signs and the nursing notes.  Pertinent labs & imaging results that were available during my care of the patient were reviewed by me and considered in my medical decision making (see chart for details).    41 year old male presents with otitis externa.  Treating with  Ciprodex.  Advised to follow-up with ENT.  Final Clinical Impressions(s) / UC Diagnoses   Final diagnoses:  Acute otitis externa of left ear, unspecified type   Discharge Instructions   None    ED Prescriptions     Medication Sig Dispense Auth. Provider   ciprofloxacin-dexamethasone (CIPRODEX) OTIC suspension Place 4 drops into the left ear 2 (two) times daily for 7 days. 7.5 mL Tommie Sams, DO      PDMP not reviewed this encounter.   Tommie Sams, [place] 12/26/21 [phone]

## 2021-12-26 NOTE — ED Triage Notes (Signed)
Pt c/o reoccuring ear infection - left ear   Pt states that tylenol does not help the pain.

## 2021-12-26 NOTE — Telephone Encounter (Signed)
Patient wanted his prescription sent to [facility] in [place] since [facility] is closed today.

## 2022-11-10 ENCOUNTER — Ambulatory Visit (INDEPENDENT_AMBULATORY_CARE_PROVIDER_SITE_OTHER): Payer: Managed Care, Other (non HMO) | Admitting: Physician Assistant

## 2022-11-10 VITALS — BP 104/76 | HR 62 | Ht 70.0 in | Wt 262.0 lb

## 2022-11-10 DIAGNOSIS — Z114 Encounter for screening for human immunodeficiency virus [HIV]: Secondary | ICD-10-CM

## 2022-11-10 DIAGNOSIS — H60503 Unspecified acute noninfective otitis externa, bilateral: Secondary | ICD-10-CM

## 2022-11-10 DIAGNOSIS — Z13220 Encounter for screening for lipoid disorders: Secondary | ICD-10-CM

## 2022-11-10 DIAGNOSIS — Z1159 Encounter for screening for other viral diseases: Secondary | ICD-10-CM

## 2022-11-10 DIAGNOSIS — Z0000 Encounter for general adult medical examination without abnormal findings: Secondary | ICD-10-CM | POA: Diagnosis not present

## 2022-11-10 MED ORDER — CIPROFLOXACIN-DEXAMETHASONE 0.3-0.1 % OT SUSP
4.0000 [drp] | Freq: Two times a day (BID) | OTIC | 0 refills | Status: AC
Start: 2022-11-10 — End: 2022-11-20

## 2022-11-10 NOTE — Progress Notes (Signed)
Date:  11/10/2022   Name:  Glenn Serrano   DOB:  [birth year]   MRN:  [id number]   Chief Complaint: Establish Care and Annual Exam  HPI  Glenn Serrano is a 42 y.o. male with history of obesity and former smoking (quit 2017) new to the practice today to establish care and complete CPE with routine labs. He feels well. He reports exercising excercising 2-3 times a week. He reports he is sleeping poorly and always has since childhood.  Last Physical: >5y ago Last Dental Exam: >5y ago Last Eye Exam: never   Headaches most days x3wks, around the forehead, usually in the afternoon, increased stress recently. Improve with Tylenol. Hx rare migraines with aura, last one was 1y ago.   Acute on chronic ear infection bilaterally, active presently. Previously saw ENT Dr. Elenore Rota and would like to return.   Needs tooth extraction. Brushes once per day most days.    There is no immunization history on file for this patient. Health Maintenance Due  Topic Date Due   HIV Screening  Never done   Hepatitis C Screening  Never done   DTaP/Tdap/Td (1 - Tdap) Never done   COVID-19 Vaccine (1 - 2023-24 season) Never done    No results found for: "PSA1", "PSA"   Medication list has been reviewed and updated.  Current Meds  Medication Sig   ciprofloxacin-dexamethasone (CIPRODEX) OTIC suspension Place 4 drops into both ears 2 (two) times daily for 10 days.     Review of Systems  Constitutional:  Negative for activity change, appetite change, fatigue and unexpected weight change.  HENT:  Positive for dental problem, ear discharge and ear pain. Negative for hearing loss and trouble swallowing.   Eyes:  Negative for visual disturbance.  Respiratory:  Negative for cough, chest tightness, shortness of breath and wheezing.   Cardiovascular:  Negative for chest pain, palpitations and leg swelling.  Gastrointestinal:  Negative for abdominal distention, abdominal pain, blood in stool,  constipation and diarrhea.  Endocrine: Negative for polydipsia, polyphagia and polyuria.  Genitourinary:  Negative for difficulty urinating, dysuria, enuresis, frequency, hematuria and testicular pain.  Musculoskeletal:  Negative for arthralgias, gait problem and joint swelling.  Skin:  Negative for rash and wound.  Neurological:  Positive for headaches. Negative for dizziness, syncope and weakness.  Psychiatric/Behavioral:  Positive for sleep disturbance. Negative for behavioral problems.     Patient Active Problem List   Diagnosis Date Noted   Smoker 06/01/2015   Ureteral stone 07/02/2014   Liver lesion 07/02/2014    Allergies  Allergen Reactions   Amoxicillin Shortness Of Breath and Rash   Penicillins Hives and Shortness Of Breath     There is no immunization history on file for this patient.  Past Surgical History:  Procedure Laterality Date   APPENDECTOMY     CYSTOSCOPY W/ RETROGRADES Left 09/10/2019   Procedure: CYSTOSCOPY WITH RETROGRADE PYELOGRAM;  Surgeon: Riki Altes, MD;  Location: [facility];  Service: Urology;  Laterality: Left;   CYSTOSCOPY W/ RETROGRADES Left 09/24/2019   Procedure: CYSTOSCOPY WITH RETROGRADE PYELOGRAM;  Surgeon: Riki Altes, MD;  Location: [facility];  Service: Urology;  Laterality: Left;   CYSTOSCOPY WITH STENT PLACEMENT Left 09/10/2019   Procedure: CYSTOSCOPY WITH STENT PLACEMENT;  Surgeon: Riki Altes, MD;  Location: [facility];  Service: Urology;  Laterality: Left;   CYSTOSCOPY WITH URETHRAL DILATATION Left 09/10/2019   Procedure: CYSTOSCOPY WITH URETHRAL DILATATION;  Surgeon: Riki Altes, MD;  Location: [facility];  Service: Urology;  Laterality: Left;   CYSTOSCOPY/URETEROSCOPY/HOLMIUM LASER/STENT PLACEMENT Left 09/24/2019   Procedure: CYSTOSCOPY/URETEROSCOPY/HOLMIUM LASER/STENT Exchange;  Surgeon: Riki Altes, MD;  Location: [facility];  Service: Urology;  Laterality: Left;   DISTAL BICEPS TENDON REPAIR Left 05/06/2021   Procedure:  Left distal biceps repair;  Surgeon: Signa Kell, MD;  Location: [facility];  Service: Orthopedics;  Laterality: Left;   ELBOW SURGERY     EXCISION MASS UPPER EXTREMETIES Left 09/09/2021   Procedure: Left elbow heterotopic ossification excision;  Surgeon: Signa Kell, MD;  Location: [facility];  Service: Orthopedics;  Laterality: Left;   MYRINGOTOMY WITH TUBE PLACEMENT Right 08/16/2018   Procedure: MYRINGOTOMY WITH TUBE PLACEMENT;  Surgeon: Vernie Murders, MD;  Location: [facility];  Service: ENT;  Laterality: Right;   NASOPHARYNGOSCOPY EUSTATION TUBE BALLOON DILATION Bilateral 08/16/2018   Procedure: NASOPHARYNGOSCOPY EUSTATION TUBE BALLOON DILATION;  Surgeon: Vernie Murders, MD;  Location: [facility];  Service: ENT;  Laterality: Bilateral;   TONSILLECTOMY     TURBINATE REDUCTION Bilateral 08/16/2018   Procedure: TURBINATE REDUCTION Outfracture inferior;  Surgeon: Vernie Murders, MD;  Location: [facility];  Service: ENT;  Laterality: Bilateral;   URETEROSCOPY Left 09/10/2019   Procedure: URETEROSCOPY;  Surgeon: Riki Altes, MD;  Location: [facility];  Service: Urology;  Laterality: Left;    Social History   Tobacco Use   Smoking status: Former    Current packs/day: 0.00    Average packs/day: 1 pack/day for 16.0 years (16.0 ttl pk-yrs)    Types: Cigarettes, Cigars    Start date: 07/18/1999    Quit date: 07/18/2015    Years since quitting: 7.3   Smokeless tobacco: Never   Tobacco comments:    July 2017 switched from cigarettes to vaping  Vaping Use   Vaping status: Every Day   Start date: 07/18/2015   Substances: Nicotine, Flavoring, Nicotine-salt  Substance Use Topics   Alcohol use: Yes    Alcohol/week: 3.0 - 4.0 standard drinks of alcohol    Types: 3 - 4 Cans of beer per week    Comment: occasional - 1x/month   Drug use: No    Family History  Problem Relation Age of Onset   Hypertension Father    Cirrhosis Father    Cancer Paternal Grandfather     Pancreatic cancer Paternal Grandfather    Bladder Cancer Paternal Grandfather    Hematuria Other        paternal   Kidney cancer Other        paternal   Prostate cancer Other        paternal   Kidney failure Other        paternal        11/10/2022    2:06 PM  GAD 7 : Generalized Anxiety Score  Nervous, Anxious, on Edge 3  Control/stop worrying 2  Worry too much - different things 3  Trouble relaxing 1  Restless 1  Easily annoyed or irritable 0  Afraid - awful might happen 1  Total GAD 7 Score 11  Anxiety Difficulty Somewhat difficult       11/10/2022    2:06 PM  Depression screen PHQ 2/9  Decreased Interest 1  Down, Depressed, Hopeless 0  PHQ - 2 Score 1  Altered sleeping 3  Tired, decreased energy 3  Change in appetite 1  Feeling bad or failure about yourself  0  Trouble concentrating 2  Moving slowly or fidgety/restless 1  Suicidal  thoughts 0  PHQ-9 Score 11  Difficult doing work/chores Not difficult at all    BP Readings from Last 3 Encounters:  11/10/22 104/76  12/26/21 122/79  09/09/21 (!) 129/90    Wt Readings from Last 3 Encounters:  11/10/22 262 lb (118.8 kg)  12/26/21 270 lb (122.5 kg)  09/09/21 268 lb 3.2 oz (121.7 kg)    BP 104/76   Pulse 62   Ht 5\' 10"  (1.778 m)   Wt 262 lb (118.8 kg)   SpO2 98%   BMI 37.59 kg/m   Physical Exam Vitals and nursing note reviewed.  Constitutional:      Appearance: Normal appearance.  HENT:     Right Ear: Drainage present.     Left Ear: Drainage present.     Ears:     Comments: EAC obstructed bilaterally with watery discharge. The visible portion of TM appears deformed bilaterally.    Nose: Nose normal.     Mouth/Throat:     Mouth: Mucous membranes are moist. No oral lesions.     Dentition: Gingival swelling and dental caries present.     Tongue: No lesions.     Pharynx: No posterior oropharyngeal erythema.      Comments: Marked dental decay of left lower incisor which will likely require  extraction.  Visible plaque buildup universally, gingival disease apparent throughout. Eyes:     Extraocular Movements: Extraocular movements intact.     Pupils: Pupils are equal, round, and reactive to light.     Comments: Moderate to severe injection of palpebral conjunctiva bilaterally  Neck:     Thyroid: No thyromegaly.  Cardiovascular:     Rate and Rhythm: Normal rate and regular rhythm.     Heart sounds: No murmur heard.    No friction rub. No gallop.     Comments: Pulses 2+ at radial, PT, DP bilaterally. No carotid bruit. No peripheral edema Pulmonary:     Effort: Pulmonary effort is normal.     Breath sounds: Normal breath sounds.  Abdominal:     General: Bowel sounds are normal.     Palpations: Abdomen is soft. There is no mass.     Tenderness: There is no abdominal tenderness.  Genitourinary:    Comments: Deferred. Reviewed self-exam technique Musculoskeletal:     Comments: Full ROM with strength 5/5 bilateral upper and lower extremities  Lymphadenopathy:     Cervical: No cervical adenopathy.  Skin:    General: Skin is warm.     Capillary Refill: Capillary refill takes less than 2 seconds.     Findings: No lesion or rash.  Neurological:     Mental Status: He is alert and oriented to person, place, and time.     Gait: Gait is intact.  Psychiatric:        Mood and Affect: Mood normal.        Behavior: Behavior normal.     Recent Labs     Component Value Date/Time   NA 135 08/13/2019 1314   K 4.1 08/13/2019 1314   CL 106 08/13/2019 1314   CO2 25 08/13/2019 1314   GLUCOSE 115 (H) 08/13/2019 1314   BUN 15 08/13/2019 1314   CREATININE 0.98 08/13/2019 1314   CALCIUM 9.0 08/13/2019 1314   GFRNONAA >60 08/13/2019 1314   GFRAA >60 08/13/2019 1314    Lab Results  Component Value Date   WBC 5.4 08/13/2019   HGB 15.3 08/13/2019   HCT 42.9 08/13/2019   MCV 81.4 08/13/2019  PLT 350 08/13/2019   No results found for: "HGBA1C" No results found for: "CHOL",  "HDL", "LDLCALC", "LDLDIRECT", "TRIG", "CHOLHDL" No results found for: "TSH"   Assessment and Plan:  1. Annual physical exam Fairly healthy patient with minor abnormalities on exam. Encouraged healthy lifestyle including regular physical activity and consumption of whole fruits and vegetables. Encouraged routine dental and eye exams. Due for Tdap in near future.  Check baseline labs as below  - CBC with Differential/Platelet - Comprehensive metabolic panel - TSH - Hepatitis C antibody - HIV Antibody (routine testing w rflx) - Lipid panel  2. Acute otitis externa of both ears, unspecified type Treat as below with Ciprodex, refer back to ENT Dr. Elenore Rota who he really liked in the past. - Ambulatory referral to ENT - ciprofloxacin-dexamethasone (CIPRODEX) OTIC suspension; Place 4 drops into both ears 2 (two) times daily for 10 days.  Dispense: 7.5 mL; Refill: 0  3. Screening for hyperlipidemia - Lipid panel  4. Screening for HIV (human immunodeficiency virus) - HIV Antibody (routine testing w rflx)  5. Need for hepatitis C screening test - Hepatitis C antibody     Return in about 1 year (around 11/10/2023) for CPE. Sooner if needed pending labs and response to otic abx   Alvester Morin, PA-C, DMSc, Nutritionist [facility] [phone]

## 2022-11-10 NOTE — Patient Instructions (Signed)
-  It was a pleasure to see you today! Please review your visit summary for helpful information -Lab results are usually available within 1-2 days and we will call once reviewed -I would encourage you to follow your care via MyChart where you can access lab results, notes, messages, and more -If you feel that we did a nice job today, please complete your after-visit survey and leave us a Google review! Your CMA today was Kieandra and your provider was Dan Velta Rockholt, PA-C, DMSc -Please return for follow-up in about 1y   

## 2022-11-11 ENCOUNTER — Other Ambulatory Visit: Payer: Self-pay | Admitting: Physician Assistant

## 2022-11-11 DIAGNOSIS — R7989 Other specified abnormal findings of blood chemistry: Secondary | ICD-10-CM | POA: Insufficient documentation

## 2022-11-11 LAB — CBC WITH DIFFERENTIAL/PLATELET
Basophils Absolute: 0.1 10*3/uL (ref 0.0–0.2)
Basos: 1 %
EOS (ABSOLUTE): 0.1 10*3/uL (ref 0.0–0.4)
Eos: 2 %
Hematocrit: 49.1 % (ref 37.5–51.0)
Hemoglobin: 16.4 g/dL (ref 13.0–17.7)
Immature Grans (Abs): 0 10*3/uL (ref 0.0–0.1)
Immature Granulocytes: 0 %
Lymphocytes Absolute: 1.9 10*3/uL (ref 0.7–3.1)
Lymphs: 30 %
MCH: 28.8 pg (ref 26.6–33.0)
MCHC: 33.4 g/dL (ref 31.5–35.7)
MCV: 86 fL (ref 79–97)
Monocytes Absolute: 0.5 10*3/uL (ref 0.1–0.9)
Monocytes: 8 %
Neutrophils Absolute: 3.6 10*3/uL (ref 1.4–7.0)
Neutrophils: 59 %
Platelets: 396 10*3/uL (ref 150–450)
RBC: 5.69 x10E6/uL (ref 4.14–5.80)
RDW: 13.2 % (ref 11.6–15.4)
WBC: 6.2 10*3/uL (ref 3.4–10.8)

## 2022-11-11 LAB — COMPREHENSIVE METABOLIC PANEL
ALT: 110 [IU]/L — ABNORMAL HIGH (ref 0–44)
AST: 55 [IU]/L — ABNORMAL HIGH (ref 0–40)
Albumin: 4.8 g/dL (ref 4.1–5.1)
Alkaline Phosphatase: 82 [IU]/L (ref 44–121)
BUN/Creatinine Ratio: 14 (ref 9–20)
BUN: 14 mg/dL (ref 6–24)
Bilirubin Total: 0.5 mg/dL (ref 0.0–1.2)
CO2: 21 mmol/L (ref 20–29)
Calcium: 9.3 mg/dL (ref 8.7–10.2)
Chloride: 104 mmol/L (ref 96–106)
Creatinine, Ser: 1.02 mg/dL (ref 0.76–1.27)
Globulin, Total: 3.1 g/dL (ref 1.5–4.5)
Glucose: 82 mg/dL (ref 70–99)
Potassium: 4.7 mmol/L (ref 3.5–5.2)
Sodium: 142 mmol/L (ref 134–144)
Total Protein: 7.9 g/dL (ref 6.0–8.5)
eGFR: 95 mL/min/{1.73_m2} (ref >59)

## 2022-11-11 LAB — LIPID PANEL
Chol/HDL Ratio: 3.9 ratio (ref 0.0–5.0)
Cholesterol, Total: 194 mg/dL (ref 100–199)
HDL: 50 mg/dL (ref >39)
LDL Chol Calc (NIH): 119 mg/dL — ABNORMAL HIGH (ref 0–99)
Triglycerides: 140 mg/dL (ref 0–149)
VLDL Cholesterol Cal: 25 mg/dL (ref 5–40)

## 2022-11-11 LAB — TSH: TSH: 0.876 u[IU]/mL (ref 0.450–4.500)

## 2022-11-11 LAB — HIV ANTIBODY (ROUTINE TESTING W REFLEX): HIV Screen 4th Generation wRfx: NONREACTIVE

## 2022-11-11 LAB — HEPATITIS C ANTIBODY: Hep C Virus Ab: NONREACTIVE

## 2022-11-30 ENCOUNTER — Other Ambulatory Visit: Payer: Self-pay | Admitting: Otolaryngology

## 2022-11-30 DIAGNOSIS — H61819 Exostosis of external canal, unspecified ear: Secondary | ICD-10-CM

## 2022-12-22 ENCOUNTER — Ambulatory Visit
Admission: RE | Admit: 2022-12-22 | Discharge: 2022-12-22 | Disposition: A | Discharge status: Home / Self Care | Payer: Managed Care, Other (non HMO) | Source: Ambulatory Visit | Attending: Otolaryngology | Admitting: Otolaryngology

## 2022-12-22 DIAGNOSIS — H61819 Exostosis of external canal, unspecified ear: Secondary | ICD-10-CM

## 2022-12-22 MED ORDER — IOPAMIDOL (ISOVUE-300) INJECTION 61%
70.0000 mL | Freq: Once | INTRAVENOUS | Status: AC | PRN
  Administered 2022-12-22: 70 mL via INTRAVENOUS

## 2023-03-15 ENCOUNTER — Telehealth: Payer: Self-pay | Admitting: Physician Assistant

## 2023-03-15 NOTE — Telephone Encounter (Signed)
 Patient also informed of below via phone.

## 2023-03-15 NOTE — Telephone Encounter (Unsigned)
 Copied from CRM [id number]. Topic: Clinical - Request for Lab/Test Order >> Mar 15, 2023  8:33 AM Gildardo Pounds wrote: Reason for CRM: Patient was advised to have blood work done to check his liver enzymes in previous appointment. Does the patient need to schedule an appointment or can labs be requested? Callback number is [phone]. Please leave a VM advising patient if he does not answer.

## 2023-03-18 LAB — HEPATIC FUNCTION PANEL
ALT: 103 IU/L — ABNORMAL HIGH (ref 0–44)
AST: 45 IU/L — ABNORMAL HIGH (ref 0–40)
Albumin: 4.4 g/dL (ref 4.1–5.1)
Alkaline Phosphatase: 81 IU/L (ref 44–121)
Bilirubin Total: 0.5 mg/dL (ref 0.0–1.2)
Bilirubin, Direct: 0.14 mg/dL (ref 0.00–0.40)
Total Protein: 7.1 g/dL (ref 6.0–8.5)

## 2023-03-20 ENCOUNTER — Other Ambulatory Visit: Payer: Self-pay | Admitting: Physician Assistant

## 2023-03-20 DIAGNOSIS — K769 Liver disease, unspecified: Secondary | ICD-10-CM

## 2023-03-20 DIAGNOSIS — R7989 Other specified abnormal findings of blood chemistry: Secondary | ICD-10-CM

## 2023-03-20 NOTE — Telephone Encounter (Signed)
 Please review.  KP

## 2023-03-29 ENCOUNTER — Ambulatory Visit
Admission: RE | Admit: 2023-03-29 | Discharge: 2023-03-29 | Disposition: A | Discharge status: Home / Self Care | Source: Ambulatory Visit | Attending: Physician Assistant | Admitting: Physician Assistant

## 2023-03-29 DIAGNOSIS — R7989 Other specified abnormal findings of blood chemistry: Secondary | ICD-10-CM | POA: Insufficient documentation

## 2023-03-29 DIAGNOSIS — K769 Liver disease, unspecified: Secondary | ICD-10-CM | POA: Insufficient documentation

## 2023-03-30 ENCOUNTER — Other Ambulatory Visit: Payer: Self-pay | Admitting: Physician Assistant

## 2023-03-30 ENCOUNTER — Encounter: Payer: Self-pay | Admitting: Physician Assistant

## 2023-03-30 DIAGNOSIS — K769 Liver disease, unspecified: Secondary | ICD-10-CM

## 2023-03-30 DIAGNOSIS — K760 Fatty (change of) liver, not elsewhere classified: Secondary | ICD-10-CM | POA: Insufficient documentation

## 2023-04-10 ENCOUNTER — Other Ambulatory Visit: Payer: Self-pay | Admitting: Physician Assistant

## 2023-04-10 DIAGNOSIS — K769 Liver disease, unspecified: Secondary | ICD-10-CM

## 2023-04-26 ENCOUNTER — Ambulatory Visit
Admission: RE | Admit: 2023-04-26 | Discharge: 2023-04-26 | Disposition: A | Discharge status: Home / Self Care | Source: Ambulatory Visit | Attending: Physician Assistant | Admitting: Physician Assistant

## 2023-04-26 DIAGNOSIS — K769 Liver disease, unspecified: Secondary | ICD-10-CM

## 2023-04-26 MED ORDER — GADOPICLENOL 0.5 MMOL/ML IV SOLN
10.0000 mL | Freq: Once | INTRAVENOUS | Status: AC | PRN
Start: 2023-04-26 — End: 2023-04-26
  Administered 2023-04-26: 10 mL via INTRAVENOUS

## 2023-04-27 ENCOUNTER — Encounter: Payer: Self-pay | Admitting: Physician Assistant

## 2023-04-27 NOTE — Telephone Encounter (Signed)
 Please review.  KP

## 2023-05-01 ENCOUNTER — Other Ambulatory Visit: Payer: Self-pay | Admitting: Physician Assistant

## 2023-05-01 DIAGNOSIS — D1803 Hemangioma of intra-abdominal structures: Secondary | ICD-10-CM

## 2023-09-06 ENCOUNTER — Ambulatory Visit (INDEPENDENT_AMBULATORY_CARE_PROVIDER_SITE_OTHER): Admitting: Physician Assistant

## 2023-09-06 ENCOUNTER — Encounter: Payer: Self-pay | Admitting: Physician Assistant

## 2023-09-06 VITALS — BP 128/88 | HR 67 | Temp 98.0°F | Ht 70.0 in | Wt 268.0 lb

## 2023-09-06 DIAGNOSIS — R0683 Snoring: Secondary | ICD-10-CM | POA: Diagnosis not present

## 2023-09-06 DIAGNOSIS — Z720 Tobacco use: Secondary | ICD-10-CM | POA: Diagnosis not present

## 2023-09-06 DIAGNOSIS — E785 Hyperlipidemia, unspecified: Secondary | ICD-10-CM | POA: Insufficient documentation

## 2023-09-06 DIAGNOSIS — Z6838 Body mass index (BMI) 38.0-38.9, adult: Secondary | ICD-10-CM

## 2023-09-06 DIAGNOSIS — E66812 Obesity, class 2: Secondary | ICD-10-CM | POA: Diagnosis not present

## 2023-09-06 MED ORDER — PHENTERMINE HCL 37.5 MG PO TABS: 37.5000 mg | ORAL_TABLET | Freq: Every day | ORAL | 0 refills | Status: DC

## 2023-09-06 NOTE — Assessment & Plan Note (Addendum)
 Discussed options for pharmacotherapy with patient.  He seems somewhat aversive to the idea of GLP-1 RA medication, and is also primarily interested in restoring energy levels during the day.  We discussed side effects, risks, and benefits of phentermine  use and ultimately decided to proceed with this medication.  He has been advised to use only 0.5 tablet QAM for the first 2-3 days, then could increase to full tablet if needed.  We also discussed that phentermine  is only approved for short-term use, so we will plan for 3 months on this medication, after which he will need to either discontinue or switch to an alternative medication, perhaps Qsymia if not cost prohibitive.  We discussed that while phentermine  will help with his energy levels temporarily, it does not address the underlying problem.

## 2023-09-06 NOTE — Assessment & Plan Note (Signed)
 Discussed with patient some possibility of sleep apnea at play, especially given his pronounced daytime fatigue forcing him to nap most days.  Encouraged use of SnoreLab or similar app as initial screening, but could proceed with formal sleep study at any time.

## 2023-09-06 NOTE — Progress Notes (Signed)
 Date:  09/06/2023   Name:  Glenn Serrano   DOB:  05-25-1980   MRN:  [id number]   Chief Complaint: Weight Check (Low energy, hard to lose weight even with going to the gym, open to pills or injection )  HPI Glenn Serrano presents today for consult regarding weight management.  He has class 2 severe obesity with comorbidities of NAFLD and mild HLD. He states that his wife takes phentermine  to help with weight loss and energy level, and he is interested in trying this medication.  Endorses mid-day fatigue described as a crash or hitting a wall ongoing for months. This causes him to require a nap most days. He admits to snoring, but no known sleep apnea to his knowledge. His wife has not mentioned nocturnal choking/gasping.   He has chronically struggled with his weight, reporting fluctuation between 250-280 lb for the last 10-15 years. 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   He feels that he has a relatively nutritive diet with regular consumption of fruits and vegetables.  Historically has skipped breakfast, but in recent months has tried to eat a small breakfast to help with his energy problem without much benefit.  He does not drink alcohol, but does endorse consuming juice with every meal estimating about 4 cups of juice every day, mainly V8 and sometimes Juicy Juice.   Medication list has been reviewed and updated.  Current Meds  Medication Sig   phentermine  (ADIPEX-P ) 37.5 MG tablet Take 1 tablet (37.5 mg total) by mouth daily before breakfast.     Review of Systems  Patient Active Problem List   Diagnosis Date Noted   Class 2 severe obesity with serious comorbidity in adult (HCC) 09/06/2023   Mild hyperlipidemia 09/06/2023   Vapes nicotine containing substance 09/06/2023   Snoring 09/06/2023   NAFLD (nonalcoholic fatty liver disease) 96/86/7974   Elevated LFTs  11/11/2022   Former smoker 06/01/2015   History of ureterolithiasis 07/02/2014   Hepatic hemangioma 07/02/2014    Allergies  Allergen Reactions   Amoxicillin Shortness Of Breath and Rash   Penicillins Hives and Shortness Of Breath     There is no immunization history on file for this patient.  Past Surgical History:  Procedure Laterality Date   APPENDECTOMY     CYSTOSCOPY W/ RETROGRADES Left 09/10/2019   Procedure: CYSTOSCOPY WITH RETROGRADE PYELOGRAM;  Surgeon: Twylla Glendia BROCKS, MD;  Location: [facility];  Service: Urology;  Laterality: Left;   CYSTOSCOPY W/ RETROGRADES Left 09/24/2019   Procedure: CYSTOSCOPY WITH RETROGRADE PYELOGRAM;  Surgeon: Twylla Glendia BROCKS, MD;  Location: [facility];  Service: Urology;  Laterality: Left;   CYSTOSCOPY WITH STENT PLACEMENT Left 09/10/2019   Procedure: CYSTOSCOPY WITH STENT PLACEMENT;  Surgeon: Twylla Glendia BROCKS, MD;  Location: [facility];  Service: Urology;  Laterality: Left;   CYSTOSCOPY WITH URETHRAL DILATATION Left 09/10/2019   Procedure: CYSTOSCOPY WITH URETHRAL DILATATION;  Surgeon: Twylla Glendia BROCKS, MD;  Location: [facility];  Service: Urology;  Laterality: Left;   CYSTOSCOPY/URETEROSCOPY/HOLMIUM LASER/STENT PLACEMENT Left 09/24/2019   Procedure: CYSTOSCOPY/URETEROSCOPY/HOLMIUM LASER/STENT Exchange;  Surgeon: Twylla Glendia BROCKS, MD;  Location: [facility];  Service: Urology;  Laterality: Left;   DISTAL BICEPS TENDON REPAIR Left 05/06/2021   Procedure: Left distal biceps repair;  Surgeon: Tobie Priest, MD;  Location: [facility];  Service: Orthopedics;  Laterality: Left;   ELBOW SURGERY     EXCISION MASS  UPPER EXTREMETIES Left 09/09/2021   Procedure: Left elbow heterotopic ossification excision;  Surgeon: Tobie Priest, MD;  Location: [facility];  Service: Orthopedics;  Laterality: Left;   MYRINGOTOMY WITH TUBE PLACEMENT Right 08/16/2018   Procedure: MYRINGOTOMY WITH TUBE PLACEMENT;  Surgeon: Edda Mt, MD;  Location: [facility];  Service: ENT;   Laterality: Right;   NASOPHARYNGOSCOPY EUSTATION TUBE BALLOON DILATION Bilateral 08/16/2018   Procedure: NASOPHARYNGOSCOPY EUSTATION TUBE BALLOON DILATION;  Surgeon: Edda Mt, MD;  Location: [facility];  Service: ENT;  Laterality: Bilateral;   TONSILLECTOMY     TURBINATE REDUCTION Bilateral 08/16/2018   Procedure: TURBINATE REDUCTION Outfracture inferior;  Surgeon: Edda Mt, MD;  Location: [facility];  Service: ENT;  Laterality: Bilateral;   URETEROSCOPY Left 09/10/2019   Procedure: URETEROSCOPY;  Surgeon: Twylla Glendia BROCKS, MD;  Location: [facility];  Service: Urology;  Laterality: Left;    Social History   Tobacco Use   Smoking status: Former    Current packs/day: 0.00    Average packs/day: 1 pack/day for 16.0 years (16.0 ttl pk-yrs)    Types: Cigarettes, Cigars    Start date: 07/18/1999    Quit date: 07/18/2015    Years since quitting: 8.1   Smokeless tobacco: Never   Tobacco comments:    July 2017 switched from cigarettes to vaping  Vaping Use   Vaping status: Every Day   Start date: 07/18/2015   Substances: Nicotine, Flavoring, Nicotine-salt  Substance Use Topics   Alcohol use: Yes    Alcohol/week: 3.0 - 4.0 standard drinks of alcohol    Types: 3 - 4 Cans of beer per week    Comment: occasional - 1x/month   Drug use: No    Family History  Problem Relation Age of Onset   Hypertension Father    Cirrhosis Father    Cancer Paternal Grandfather    Pancreatic cancer Paternal Grandfather    Bladder Cancer Paternal Grandfather    Hematuria Other        paternal   Kidney cancer Other        paternal   Prostate cancer Other        paternal   Kidney failure Other        paternal        09/06/2023    9:18 AM 11/10/2022    2:06 PM  GAD 7 : Generalized Anxiety Score  Nervous, Anxious, on Edge 0 3  Control/stop worrying 0 2  Worry too much - different things 0 3  Trouble relaxing 0 1  Restless 0 1  Easily annoyed or irritable 0 0  Afraid - awful  might happen 0 1  Total GAD 7 Score 0 11  Anxiety Difficulty Not difficult at all Somewhat difficult       09/06/2023    9:18 AM 11/10/2022    2:06 PM  Depression screen PHQ 2/9  Decreased Interest 0 1  Down, Depressed, Hopeless 0 0  PHQ - 2 Score 0 1  Altered sleeping  3  Tired, decreased energy  3  Change in appetite  1  Feeling bad or failure about yourself   0  Trouble concentrating  2  Moving slowly or fidgety/restless  1  Suicidal thoughts  0  PHQ-9 Score  11  Difficult doing work/chores  Not difficult at all    BP Readings from Last 3 Encounters:  09/06/23 128/88  11/10/22 104/76  12/26/21 122/79    Wt Readings from Last 3 Encounters:  09/06/23 268 lb (121.6 kg)  11/10/22 262 lb (118.8 kg)  12/26/21 270 lb (122.5 kg)    BP 128/88 (Cuff Size: Large)   Pulse 67   Temp 98 F (36.7 C)   Ht 5' 10 (1.778 m)   Wt 268 lb (121.6 kg)   SpO2 97%   BMI 38.45 kg/m   Physical Exam Vitals and nursing note reviewed.  Constitutional:      Appearance: Normal appearance. He is obese.  Cardiovascular:     Rate and Rhythm: Normal rate.  Pulmonary:     Effort: Pulmonary effort is normal.  Abdominal:     General: There is no distension.  Musculoskeletal:        General: Normal range of motion.  Skin:    General: Skin is warm and dry.  Neurological:     Mental Status: He is alert and oriented to person, place, and time.     Gait: Gait is intact.  Psychiatric:        Mood and Affect: Mood and affect normal.     Recent Labs     Component Value Date/Time   NA 142 11/10/2022 1505   K 4.7 11/10/2022 1505   CL 104 11/10/2022 1505   CO2 21 11/10/2022 1505   GLUCOSE 82 11/10/2022 1505   GLUCOSE 115 (H) 08/13/2019 1314   BUN 14 11/10/2022 1505   CREATININE 1.02 11/10/2022 1505   CALCIUM 9.3 11/10/2022 1505   PROT 7.1 03/17/2023 1338   ALBUMIN 4.4 03/17/2023 1338   AST 45 (H) 03/17/2023 1338   ALT 103 (H) 03/17/2023 1338   ALKPHOS 81 03/17/2023 1338   BILITOT  0.5 03/17/2023 1338   GFRNONAA >60 08/13/2019 1314   GFRAA >60 08/13/2019 1314    Lab Results  Component Value Date   WBC 6.2 11/10/2022   HGB 16.4 11/10/2022   HCT 49.1 11/10/2022   MCV 86 11/10/2022   PLT 396 11/10/2022   No results found for: HGBA1C Lab Results  Component Value Date   CHOL 194 11/10/2022   HDL 50 11/10/2022   LDLCALC 119 (H) 11/10/2022   TRIG 140 11/10/2022   CHOLHDL 3.9 11/10/2022   Lab Results  Component Value Date   TSH 0.876 11/10/2022      Assessment and Plan:  Class 2 severe obesity due to excess calories with serious comorbidity and body mass index (BMI) of 38.0 to 38.9 in adult [facility]) Assessment & Plan: Discussed options for pharmacotherapy with patient.  He seems somewhat aversive to the idea of GLP-1 RA medication, and is also primarily interested in restoring energy levels during the day.  We discussed side effects, risks, and benefits of phentermine  use and ultimately decided to proceed with this medication.  He has been advised to use only 0.5 tablet QAM for the first 2-3 days, then could increase to full tablet if needed.  We also discussed that phentermine  is only approved for short-term use, so we will plan for 3 months on this medication, after which he will need to either discontinue or switch to an alternative medication, perhaps Qsymia if not cost prohibitive.  We discussed that while phentermine  will help with his energy levels temporarily, it does not address the underlying problem.  Orders: -     Phentermine  HCl; Take 1 tablet (37.5 mg total) by mouth daily before breakfast.  Dispense: 30 tablet; Refill: 0  Vapes nicotine containing substance Assessment & Plan: Encouraged reduction of nicotine concentration in his vape.  Patient  states that Chantix  has actually worked well for stopping nicotine, both when he was smoking, and also with the nicotine vape.  Since restarting phentermine , we will hold off on Chantix  prescription for  now.  We did discuss the negative impact of nicotine in both hypertension and sleep.   Snoring Assessment & Plan: Discussed with patient some possibility of sleep apnea at play, especially given his pronounced daytime fatigue forcing him to nap most days.  Encouraged use of SnoreLab or similar app as initial screening, but could proceed with formal sleep study at any time.      Return in about 4 weeks (around 10/04/2023) for OV f/u wt mgmt.    Rolan Hoyle, PA-C, DMSc, Nutritionist [facility] Primary Care and Sports Medicine [facility] [phone]

## 2023-09-06 NOTE — Assessment & Plan Note (Signed)
 Encouraged reduction of nicotine concentration in his vape.  Patient states that Chantix  has actually worked well for stopping nicotine, both when he was smoking, and also with the nicotine vape.  Since restarting phentermine , we will hold off on Chantix  prescription for now.  We did discuss the negative impact of nicotine in both hypertension and sleep.

## 2023-10-04 ENCOUNTER — Ambulatory Visit (INDEPENDENT_AMBULATORY_CARE_PROVIDER_SITE_OTHER): Admitting: Physician Assistant

## 2023-10-04 ENCOUNTER — Encounter: Payer: Self-pay | Admitting: Physician Assistant

## 2023-10-04 DIAGNOSIS — Z6836 Body mass index (BMI) 36.0-36.9, adult: Secondary | ICD-10-CM | POA: Diagnosis not present

## 2023-10-04 DIAGNOSIS — E66812 Obesity, class 2: Secondary | ICD-10-CM | POA: Diagnosis not present

## 2023-10-04 MED ORDER — PHENTERMINE HCL 37.5 MG PO TABS
37.5000 mg | ORAL_TABLET | Freq: Every day | ORAL | 0 refills | Status: DC
Start: 2023-10-04 — End: 2023-11-13

## 2023-10-04 NOTE — Assessment & Plan Note (Signed)
 Patient is doing impressively well with phentermine . Continue at the current dose with recheck in 1 month. Continue healthy lifestyle as he has been doing.   Reviewed that phentermine  is only approved for short-term use, so we will plan for 3 months on this medication, after which he will need to either taper or switch to an alternative medication, perhaps Qsymia if not cost prohibitive.  We discussed that while phentermine  will help with his energy levels temporarily, it does not address the underlying problem. Encourage to use SnoreLab app as a free informal screening for OSA.

## 2023-10-04 NOTE — Progress Notes (Signed)
 Date:  10/04/2023   Name:  Glenn Serrano   DOB:  [birth year]   MRN:  [id number]   Chief Complaint: Weight Check (Down 12 pounds, no side effects)  HPI Glenn Serrano returns for 1 month follow-up on weight management after starting phentermine  last visit, down 12 pounds since then. He takes 1 tablet each morning, and notices a big difference if he misses a dose. He has done extremely well with improvements in energy, activity, portion control.  He has no reported side effects, and in fact this has much improved his sleep.  He states he is getting a full 7 hours most nights with no disruptions.  Overall he feels much better on this medication.  He has also done a nice job reducing use of nicotine-containing vape, and intends to continue tapering off this and eventually stop entirely.   Medication list has been reviewed and updated.  Current Meds  Medication Sig   [DISCONTINUED] phentermine  (ADIPEX-P ) 37.5 MG tablet Take 1 tablet (37.5 mg total) by mouth daily before breakfast.     Review of Systems  Patient Active Problem List   Diagnosis Date Noted   Class 2 severe obesity with serious comorbidity in adult (HCC) 09/06/2023   Mild hyperlipidemia 09/06/2023   Vapes nicotine containing substance 09/06/2023   Snoring 09/06/2023   NAFLD (nonalcoholic fatty liver disease) 96/86/7974   Elevated LFTs 11/11/2022   Former smoker 06/01/2015   History of ureterolithiasis 07/02/2014   Hepatic hemangioma 07/02/2014    Allergies  Allergen Reactions   Amoxicillin Shortness Of Breath and Rash   Penicillins Hives and Shortness Of Breath     There is no immunization history on file for this patient.  Past Surgical History:  Procedure Laterality Date   APPENDECTOMY     CYSTOSCOPY W/ RETROGRADES Left 09/10/2019   Procedure: CYSTOSCOPY WITH RETROGRADE PYELOGRAM;  Surgeon: Twylla Glendia BROCKS, MD;  Location: [facility];  Service: Urology;  Laterality: Left;   CYSTOSCOPY W/ RETROGRADES Left  09/24/2019   Procedure: CYSTOSCOPY WITH RETROGRADE PYELOGRAM;  Surgeon: Twylla Glendia BROCKS, MD;  Location: [facility];  Service: Urology;  Laterality: Left;   CYSTOSCOPY WITH STENT PLACEMENT Left 09/10/2019   Procedure: CYSTOSCOPY WITH STENT PLACEMENT;  Surgeon: Twylla Glendia BROCKS, MD;  Location: [facility];  Service: Urology;  Laterality: Left;   CYSTOSCOPY WITH URETHRAL DILATATION Left 09/10/2019   Procedure: CYSTOSCOPY WITH URETHRAL DILATATION;  Surgeon: Twylla Glendia BROCKS, MD;  Location: [facility];  Service: Urology;  Laterality: Left;   CYSTOSCOPY/URETEROSCOPY/HOLMIUM LASER/STENT PLACEMENT Left 09/24/2019   Procedure: CYSTOSCOPY/URETEROSCOPY/HOLMIUM LASER/STENT Exchange;  Surgeon: Twylla Glendia BROCKS, MD;  Location: [facility];  Service: Urology;  Laterality: Left;   DISTAL BICEPS TENDON REPAIR Left 05/06/2021   Procedure: Left distal biceps repair;  Surgeon: Tobie Priest, MD;  Location: [facility];  Service: Orthopedics;  Laterality: Left;   ELBOW SURGERY     EXCISION MASS UPPER EXTREMETIES Left 09/09/2021   Procedure: Left elbow heterotopic ossification excision;  Surgeon: Tobie Priest, MD;  Location: [facility];  Service: Orthopedics;  Laterality: Left;   MYRINGOTOMY WITH TUBE PLACEMENT Right 08/16/2018   Procedure: MYRINGOTOMY WITH TUBE PLACEMENT;  Surgeon: Juengel, Paul, MD;  Location: [facility];  Service: ENT;  Laterality: Right;   NASOPHARYNGOSCOPY EUSTATION TUBE BALLOON DILATION Bilateral 08/16/2018   Procedure: NASOPHARYNGOSCOPY EUSTATION TUBE BALLOON DILATION;  Surgeon: Edda Mt, MD;  Location: [facility];  Service: ENT;  Laterality: Bilateral;   TONSILLECTOMY     TURBINATE REDUCTION Bilateral  08/16/2018   Procedure: TURBINATE REDUCTION Outfracture inferior;  Surgeon: Edda Mt, MD;  Location: [facility];  Service: ENT;  Laterality: Bilateral;   URETEROSCOPY Left 09/10/2019   Procedure: URETEROSCOPY;  Surgeon: Twylla Glendia BROCKS, MD;  Location: [facility];  Service:  Urology;  Laterality: Left;    Social History   Tobacco Use   Smoking status: Former    Current packs/day: 0.00    Average packs/day: 1 pack/day for 16.0 years (16.0 ttl pk-yrs)    Types: Cigarettes, Cigars    Start date: 07/18/1999    Quit date: 07/18/2015    Years since quitting: 8.2   Smokeless tobacco: Never   Tobacco comments:    July 2017 switched from cigarettes to vaping  Vaping Use   Vaping status: Every Day   Start date: 07/18/2015   Substances: Nicotine, Flavoring, Nicotine-salt  Substance Use Topics   Alcohol use: Yes    Alcohol/week: 3.0 - 4.0 standard drinks of alcohol    Types: 3 - 4 Cans of beer per week    Comment: occasional - 1x/month   Drug use: No    Family History  Problem Relation Age of Onset   Hypertension Father    Cirrhosis Father    Cancer Paternal Grandfather    Pancreatic cancer Paternal Grandfather    Bladder Cancer Paternal Grandfather    Hematuria Other        paternal   Kidney cancer Other        paternal   Prostate cancer Other        paternal   Kidney failure Other        paternal        10/04/2023    9:11 AM 09/06/2023    9:18 AM 11/10/2022    2:06 PM  GAD 7 : Generalized Anxiety Score  Nervous, Anxious, on Edge 0 0 3  Control/stop worrying 0 0 2  Worry too much - different things 0 0 3  Trouble relaxing 0 0 1  Restless 0 0 1  Easily annoyed or irritable 0 0 0  Afraid - awful might happen 0 0 1  Total GAD 7 Score 0 0 11  Anxiety Difficulty Not difficult at all Not difficult at all Somewhat difficult       10/04/2023    9:10 AM 09/06/2023    9:18 AM 11/10/2022    2:06 PM  Depression screen PHQ 2/9  Decreased Interest 0 0 1  Down, Depressed, Hopeless 0 0 0  PHQ - 2 Score 0 0 1  Altered sleeping   3  Tired, decreased energy   3  Change in appetite   1  Feeling bad or failure about yourself    0  Trouble concentrating   2  Moving slowly or fidgety/restless   1  Suicidal thoughts   0  PHQ-9 Score   11  Difficult  doing work/chores   Not difficult at all    BP Readings from Last 3 Encounters:  10/04/23 118/82  09/06/23 128/88  11/10/22 104/76    Wt Readings from Last 3 Encounters:  10/04/23 256 lb (116.1 kg)  09/06/23 268 lb (121.6 kg)  11/10/22 262 lb (118.8 kg)    BP 118/82   Pulse 81   Temp 98.7 F (37.1 C)   Ht 5' 10 (1.778 m)   Wt 256 lb (116.1 kg)   SpO2 97%   BMI 36.73 kg/m   Physical Exam Vitals and nursing note reviewed.  Constitutional:      Appearance: Normal appearance.  Cardiovascular:     Rate and Rhythm: Normal rate and regular rhythm.     Heart sounds: No murmur heard.    No friction rub. No gallop.  Pulmonary:     Effort: Pulmonary effort is normal.     Breath sounds: Normal breath sounds.  Abdominal:     General: There is no distension.  Musculoskeletal:        General: Normal range of motion.  Skin:    General: Skin is warm and dry.  Neurological:     Mental Status: He is alert and oriented to person, place, and time.     Gait: Gait is intact.  Psychiatric:        Mood and Affect: Mood and affect normal.     Recent Labs     Component Value Date/Time   NA 142 11/10/2022 1505   K 4.7 11/10/2022 1505   CL 104 11/10/2022 1505   CO2 21 11/10/2022 1505   GLUCOSE 82 11/10/2022 1505   GLUCOSE 115 (H) 08/13/2019 1314   BUN 14 11/10/2022 1505   CREATININE 1.02 11/10/2022 1505   CALCIUM 9.3 11/10/2022 1505   PROT 7.1 03/17/2023 1338   ALBUMIN 4.4 03/17/2023 1338   AST 45 (H) 03/17/2023 1338   ALT 103 (H) 03/17/2023 1338   ALKPHOS 81 03/17/2023 1338   BILITOT 0.5 03/17/2023 1338   GFRNONAA >60 08/13/2019 1314   GFRAA >60 08/13/2019 1314    Lab Results  Component Value Date   WBC 6.2 11/10/2022   HGB 16.4 11/10/2022   HCT 49.1 11/10/2022   MCV 86 11/10/2022   PLT 396 11/10/2022   No results found for: HGBA1C Lab Results  Component Value Date   CHOL 194 11/10/2022   HDL 50 11/10/2022   LDLCALC 119 (H) 11/10/2022   TRIG 140 11/10/2022    CHOLHDL 3.9 11/10/2022   Lab Results  Component Value Date   TSH 0.876 11/10/2022      Assessment and Plan:  Class 2 severe obesity due to excess calories with serious comorbidity and body mass index (BMI) of 36.0 to 36.9 in adult [facility]) Assessment & Plan: Patient is doing impressively well with phentermine . Continue at the current dose with recheck in 1 month. Continue healthy lifestyle as he has been doing.   Reviewed that phentermine  is only approved for short-term use, so we will plan for 3 months on this medication, after which he will need to either taper or switch to an alternative medication, perhaps Qsymia if not cost prohibitive.  We discussed that while phentermine  will help with his energy levels temporarily, it does not address the underlying problem. Encourage to use SnoreLab app as a free informal screening for OSA.  Orders: -     Phentermine  HCl; Take 1 tablet (37.5 mg total) by mouth daily before breakfast.  Dispense: 30 tablet; Refill: 0     Return in about 4 weeks (around 11/01/2023) for OV f/u phentermine .    Rolan Hoyle, PA-C, DMSc, Nutritionist [facility] Primary Care and Sports Medicine [facility] [phone]

## 2023-11-13 ENCOUNTER — Ambulatory Visit (INDEPENDENT_AMBULATORY_CARE_PROVIDER_SITE_OTHER): Payer: Self-pay | Admitting: Physician Assistant

## 2023-11-13 VITALS — BP 108/84 | HR 73 | Temp 98.1°F | Ht 70.0 in | Wt 244.0 lb

## 2023-11-13 DIAGNOSIS — Z0000 Encounter for general adult medical examination without abnormal findings: Secondary | ICD-10-CM | POA: Diagnosis not present

## 2023-11-13 DIAGNOSIS — E66812 Obesity, class 2: Secondary | ICD-10-CM

## 2023-11-13 DIAGNOSIS — Z6836 Body mass index (BMI) 36.0-36.9, adult: Secondary | ICD-10-CM

## 2023-11-13 MED ORDER — PHENTERMINE HCL 37.5 MG PO TABS: 37.5000 mg | ORAL_TABLET | Freq: Every day | ORAL | 0 refills | Status: DC

## 2023-11-13 NOTE — Progress Notes (Signed)
 Date:  11/13/2023   Name:  Glenn Serrano   DOB:  [birth year]   MRN:  [id number]   Chief Complaint: Annual Exam  HPI  Glenn Serrano presents today for routine physical. Still has not seen dentist or eye doctor since last year.   Last Physical: 11/10/22 Last Dental Exam: >5y ago. Brushes once daily.  Last Eye Exam: never  Also following up on weight management after starting phentermine  09/06/23, down 24 pounds since then. He takes 1 tablet each morning, and notices a big difference if he misses a dose. He has done extremely well with improvements in energy, activity, portion control.  He has no reported side effects, and in fact this has much improved his sleep.  He states he is getting a full 7 hours most nights with no disruptions.  Overall he feels much better on this medication.  Last visit he mentioned reducing use of nicotine-containing vape, and intends to continue tapering off this and eventually stop entirely.  He says he gets boils sometimes and recently had one on the abdomen for which he carefully performed his own I&D at home. Wife is a engineer, civil (consulting). He believes this to be resolving.   Chronic LFT elevation. Workup was unremarkable aside from hemangioma which is not large enough to consider surgical intervention. Due for recheck.  It sounds like he is at a standstill regarding ENT and frequent ear infections. By his report, they mentioned possibly drilling a hole into my skull to relieve the pressure.   Medication list has been reviewed and updated.  Current Meds  Medication Sig   [DISCONTINUED] phentermine  (ADIPEX-P ) 37.5 MG tablet Take 1 tablet (37.5 mg total) by mouth daily before breakfast.     Review of Systems  Patient Active Problem List   Diagnosis Date Noted   Class 2 severe obesity with serious comorbidity in adult 09/06/2023   Mild hyperlipidemia 09/06/2023   Vapes nicotine containing substance 09/06/2023   Snoring 09/06/2023   NAFLD (nonalcoholic fatty  liver disease) 03/30/2023   Elevated LFTs 11/11/2022   Former smoker 06/01/2015   History of ureterolithiasis 07/02/2014   Hepatic hemangioma 07/02/2014    Allergies  Allergen Reactions   Amoxicillin Shortness Of Breath and Rash   Penicillins Hives and Shortness Of Breath     There is no immunization history on file for this patient.  Past Surgical History:  Procedure Laterality Date   APPENDECTOMY     CYSTOSCOPY W/ RETROGRADES Left 09/10/2019   Procedure: CYSTOSCOPY WITH RETROGRADE PYELOGRAM;  Surgeon: Twylla Glendia BROCKS, MD;  Location: [facility];  Service: Urology;  Laterality: Left;   CYSTOSCOPY W/ RETROGRADES Left 09/24/2019   Procedure: CYSTOSCOPY WITH RETROGRADE PYELOGRAM;  Surgeon: Twylla Glendia BROCKS, MD;  Location: [facility];  Service: Urology;  Laterality: Left;   CYSTOSCOPY WITH STENT PLACEMENT Left 09/10/2019   Procedure: CYSTOSCOPY WITH STENT PLACEMENT;  Surgeon: Twylla Glendia BROCKS, MD;  Location: [facility];  Service: Urology;  Laterality: Left;   CYSTOSCOPY WITH URETHRAL DILATATION Left 09/10/2019   Procedure: CYSTOSCOPY WITH URETHRAL DILATATION;  Surgeon: Twylla Glendia BROCKS, MD;  Location: [facility];  Service: Urology;  Laterality: Left;   CYSTOSCOPY/URETEROSCOPY/HOLMIUM LASER/STENT PLACEMENT Left 09/24/2019   Procedure: CYSTOSCOPY/URETEROSCOPY/HOLMIUM LASER/STENT Exchange;  Surgeon: Twylla Glendia BROCKS, MD;  Location: [facility];  Service: Urology;  Laterality: Left;   DISTAL BICEPS TENDON REPAIR Left 05/06/2021   Procedure: Left distal biceps repair;  Surgeon: Tobie Priest, MD;  Location: [facility];  Service: Orthopedics;  Laterality: Left;  ELBOW SURGERY     EXCISION MASS UPPER EXTREMETIES Left 09/09/2021   Procedure: Left elbow heterotopic ossification excision;  Surgeon: Tobie Priest, MD;  Location: [facility];  Service: Orthopedics;  Laterality: Left;   MYRINGOTOMY WITH TUBE PLACEMENT Right 08/16/2018   Procedure: MYRINGOTOMY WITH TUBE PLACEMENT;  Surgeon: Edda Mt, MD;   Location: [facility];  Service: ENT;  Laterality: Right;   NASOPHARYNGOSCOPY EUSTATION TUBE BALLOON DILATION Bilateral 08/16/2018   Procedure: NASOPHARYNGOSCOPY EUSTATION TUBE BALLOON DILATION;  Surgeon: Edda Mt, MD;  Location: [facility];  Service: ENT;  Laterality: Bilateral;   TONSILLECTOMY     TURBINATE REDUCTION Bilateral 08/16/2018   Procedure: TURBINATE REDUCTION Outfracture inferior;  Surgeon: Edda Mt, MD;  Location: [facility];  Service: ENT;  Laterality: Bilateral;   URETEROSCOPY Left 09/10/2019   Procedure: URETEROSCOPY;  Surgeon: Twylla Glendia BROCKS, MD;  Location: [facility];  Service: Urology;  Laterality: Left;    Social History   Tobacco Use   Smoking status: Former    Current packs/day: 0.00    Average packs/day: 1 pack/day for 16.0 years (16.0 ttl pk-yrs)    Types: Cigarettes, Cigars    Start date: 07/18/1999    Quit date: 07/18/2015    Years since quitting: 8.3   Smokeless tobacco: Never   Tobacco comments:    July 2017 switched from cigarettes to vaping  Vaping Use   Vaping status: Every Day   Start date: 07/18/2015   Substances: Nicotine, Flavoring, Nicotine-salt  Substance Use Topics   Alcohol use: Yes    Alcohol/week: 3.0 - 4.0 standard drinks of alcohol    Types: 3 - 4 Cans of beer per week    Comment: occasional - 1x/month   Drug use: No    Family History  Problem Relation Age of Onset   Hypertension Father    Cirrhosis Father    Cancer Paternal Grandfather    Pancreatic cancer Paternal Grandfather    Bladder Cancer Paternal Grandfather    Hematuria Other        paternal   Kidney cancer Other        paternal   Prostate cancer Other        paternal   Kidney failure Other        paternal        11/13/2023    1:19 PM 10/04/2023    9:11 AM 09/06/2023    9:18 AM 11/10/2022    2:06 PM  GAD 7 : Generalized Anxiety Score  Nervous, Anxious, on Edge 0 0 0 3  Control/stop worrying 0 0 0 2  Worry too much - different  things 0 0 0 3  Trouble relaxing 0 0 0 1  Restless 0 0 0 1  Easily annoyed or irritable 0 0 0 0  Afraid - awful might happen 0 0 0 1  Total GAD 7 Score 0 0 0 11  Anxiety Difficulty Not difficult at all Not difficult at all Not difficult at all Somewhat difficult       11/13/2023    1:19 PM 10/04/2023    9:10 AM 09/06/2023    9:18 AM  Depression screen PHQ 2/9  Decreased Interest 0 0 0  Down, Depressed, Hopeless 0 0 0  PHQ - 2 Score 0 0 0    BP Readings from Last 3 Encounters:  11/13/23 108/84  10/04/23 118/82  09/06/23 128/88    Wt Readings from Last 3 Encounters:  11/13/23 244 lb (110.7 kg)  10/04/23 256 lb (116.1 kg)  09/06/23 268 lb (121.6 kg)    BP 108/84   Pulse 73   Temp 98.1 F (36.7 C)   Ht 5' 10 (1.778 m)   Wt 244 lb (110.7 kg)   SpO2 97%   BMI 35.01 kg/m   Physical Exam Vitals and nursing note reviewed.  Constitutional:      Appearance: Normal appearance.  HENT:     Ears:     Comments: Polyp of posterior R EAC. Left EAC with excessive moist cerumen obscuring TM.     Nose: Nose normal.     Mouth/Throat:     Mouth: Mucous membranes are moist. No oral lesions.     Dentition: Gingival swelling and dental caries present.     Tongue: No lesions.     Pharynx: No posterior oropharyngeal erythema.      Comments: Marked dental decay of left lower incisor which will likely require extraction.  Visible plaque buildup universally, gingival disease apparent throughout. Eyes:     Extraocular Movements: Extraocular movements intact.     Pupils: Pupils are equal, round, and reactive to light.  Neck:     Thyroid: No thyromegaly.  Cardiovascular:     Rate and Rhythm: Normal rate and regular rhythm.     Heart sounds: No murmur heard.    No friction rub. No gallop.     Comments: Pulses 2+ at radial, PT, DP bilaterally. No carotid bruit. No peripheral edema Pulmonary:     Effort: Pulmonary effort is normal.     Breath sounds: Normal breath sounds.  Abdominal:      General: Bowel sounds are normal.     Palpations: Abdomen is soft. There is no mass.     Tenderness: There is no abdominal tenderness.  Genitourinary:    Comments: Deferred. Reviewed self-exam technique Musculoskeletal:     Comments: Full ROM with strength 5/5 bilateral upper and lower extremities  Lymphadenopathy:     Cervical: No cervical adenopathy.  Skin:    General: Skin is warm.     Capillary Refill: Capillary refill takes less than 2 seconds.     Findings: No lesion or rash.     Comments: Evidence of healing abscess right abdomen, nontender but with mild overlying erythema and some localized induration.   Neurological:     Mental Status: He is alert and oriented to person, place, and time.     Gait: Gait is intact.  Psychiatric:        Mood and Affect: Mood normal.        Behavior: Behavior normal.     Recent Labs     Component Value Date/Time   NA 142 11/10/2022 1505   K 4.7 11/10/2022 1505   CL 104 11/10/2022 1505   CO2 21 11/10/2022 1505   GLUCOSE 82 11/10/2022 1505   GLUCOSE 115 (H) 08/13/2019 1314   BUN 14 11/10/2022 1505   CREATININE 1.02 11/10/2022 1505   CALCIUM 9.3 11/10/2022 1505   PROT 7.1 03/17/2023 1338   ALBUMIN 4.4 03/17/2023 1338   AST 45 (H) 03/17/2023 1338   ALT 103 (H) 03/17/2023 1338   ALKPHOS 81 03/17/2023 1338   BILITOT 0.5 03/17/2023 1338   GFRNONAA >60 08/13/2019 1314   GFRAA >60 08/13/2019 1314    Lab Results  Component Value Date   WBC 6.2 11/10/2022   HGB 16.4 11/10/2022   HCT 49.1 11/10/2022   MCV 86 11/10/2022   PLT 396 11/10/2022   No  results found for: HGBA1C Lab Results  Component Value Date   CHOL 194 11/10/2022   HDL 50 11/10/2022   LDLCALC 119 (H) 11/10/2022   TRIG 140 11/10/2022   CHOLHDL 3.9 11/10/2022   Lab Results  Component Value Date   TSH 0.876 11/10/2022      Assessment and Plan:  1. Annual physical exam (Primary) Encouraged healthy lifestyle including regular physical activity and  consumption of whole fruits and vegetables. Encouraged routine dental and eye exams. Encouraged twice daily brushing of teeth.  Check routine labs, nonfasting.   - CBC with Differential/Platelet - Comprehensive metabolic panel with GFR - TSH - Lipid panel  2. Class 2 severe obesity due to excess calories with serious comorbidity and body mass index (BMI) of 36.0 to 36.9 in adult Patient continues to do impressively well with phentermine . Continue at the current dose with recheck in 6 weeks month. Continue healthy lifestyle as he has been doing.   Since he is doing so well, we will plan for about 6 months on this medication, after which he will need to either taper or switch to an alternative medication, perhaps Qsymia if not cost prohibitive.  - phentermine  (ADIPEX-P ) 37.5 MG tablet; Take 1 tablet (37.5 mg total) by mouth daily before breakfast.  Dispense: 30 tablet; Refill: 0    Return in about 6 weeks (around 12/25/2023) for OV f/u weight mgmt.  Also 1y CPE  Rolan Hoyle, PA-C, DMSc, Nutritionist [facility] [phone]

## 2023-12-07 ENCOUNTER — Ambulatory Visit (INDEPENDENT_AMBULATORY_CARE_PROVIDER_SITE_OTHER): Admitting: Physician Assistant

## 2023-12-07 ENCOUNTER — Encounter: Payer: Self-pay | Admitting: Physician Assistant

## 2023-12-07 VITALS — BP 122/88 | HR 76 | Temp 97.8°F | Ht 70.0 in | Wt 239.0 lb

## 2023-12-07 DIAGNOSIS — J040 Acute laryngitis: Secondary | ICD-10-CM | POA: Diagnosis not present

## 2023-12-07 NOTE — Progress Notes (Signed)
 Date:  12/07/2023   Name:  Glenn Serrano   DOB:  11/25/1980   MRN:  [id number]   Chief Complaint: Hoarse (Sore throat)  HPI  Kayvon presents today for evaluation of acute sore throat and vocal hoarseness for the last 3 days.  He had a call out of work this week and plans to return on Monday.  Physically he feels like he is improving over the last 24 hours, but is still quite hoarse and states that it hurts to talk.  He is requesting a work note today, but otherwise does not need anything in particular from me.  Medication list has been reviewed and updated.  Current Meds  Medication Sig   phentermine  (ADIPEX-P ) 37.5 MG tablet Take 1 tablet (37.5 mg total) by mouth daily before breakfast.     Review of Systems  Patient Active Problem List   Diagnosis Date Noted   Class 2 severe obesity with serious comorbidity in adult 09/06/2023   Mild hyperlipidemia 09/06/2023   Vapes nicotine containing substance 09/06/2023   Snoring 09/06/2023   NAFLD (nonalcoholic fatty liver disease) 96/86/7974   Elevated LFTs 11/11/2022   Former smoker 06/01/2015   History of ureterolithiasis 07/02/2014   Hepatic hemangioma 07/02/2014    Allergies  Allergen Reactions   Amoxicillin Shortness Of Breath and Rash   Penicillins Hives and Shortness Of Breath     There is no immunization history on file for this patient.  Past Surgical History:  Procedure Laterality Date   APPENDECTOMY     CYSTOSCOPY W/ RETROGRADES Left 09/10/2019   Procedure: CYSTOSCOPY WITH RETROGRADE PYELOGRAM;  Surgeon: Twylla Glendia BROCKS, MD;  Location: [facility];  Service: Urology;  Laterality: Left;   CYSTOSCOPY W/ RETROGRADES Left 09/24/2019   Procedure: CYSTOSCOPY WITH RETROGRADE PYELOGRAM;  Surgeon: Twylla Glendia BROCKS, MD;  Location: [facility];  Service: Urology;  Laterality: Left;   CYSTOSCOPY WITH STENT PLACEMENT Left 09/10/2019   Procedure: CYSTOSCOPY WITH STENT PLACEMENT;  Surgeon: Twylla Glendia BROCKS, MD;  Location: [facility];  Service: Urology;  Laterality: Left;   CYSTOSCOPY WITH URETHRAL DILATATION Left 09/10/2019   Procedure: CYSTOSCOPY WITH URETHRAL DILATATION;  Surgeon: Twylla Glendia BROCKS, MD;  Location: [facility];  Service: Urology;  Laterality: Left;   CYSTOSCOPY/URETEROSCOPY/HOLMIUM LASER/STENT PLACEMENT Left 09/24/2019   Procedure: CYSTOSCOPY/URETEROSCOPY/HOLMIUM LASER/STENT Exchange;  Surgeon: Twylla Glendia BROCKS, MD;  Location: [facility];  Service: Urology;  Laterality: Left;   DISTAL BICEPS TENDON REPAIR Left 05/06/2021   Procedure: Left distal biceps repair;  Surgeon: Tobie Priest, MD;  Location: [facility];  Service: Orthopedics;  Laterality: Left;   ELBOW SURGERY     EXCISION MASS UPPER EXTREMETIES Left 09/09/2021   Procedure: Left elbow heterotopic ossification excision;  Surgeon: Tobie Priest, MD;  Location: [facility];  Service: Orthopedics;  Laterality: Left;   MYRINGOTOMY WITH TUBE PLACEMENT Right 08/16/2018   Procedure: MYRINGOTOMY WITH TUBE PLACEMENT;  Surgeon: Edda Mt, MD;  Location: [facility];  Service: ENT;  Laterality: Right;   NASOPHARYNGOSCOPY EUSTATION TUBE BALLOON DILATION Bilateral 08/16/2018   Procedure: NASOPHARYNGOSCOPY EUSTATION TUBE BALLOON DILATION;  Surgeon: Edda Mt, MD;  Location: [facility];  Service: ENT;  Laterality: Bilateral;   TONSILLECTOMY     TURBINATE REDUCTION Bilateral 08/16/2018   Procedure: TURBINATE REDUCTION Outfracture inferior;  Surgeon: Edda Mt, MD;  Location: [facility];  Service: ENT;  Laterality: Bilateral;   URETEROSCOPY Left 09/10/2019   Procedure: URETEROSCOPY;  Surgeon: Twylla Glendia BROCKS, MD;  Location: [facility];  Service: Urology;  Laterality: Left;    Social History   Tobacco Use   Smoking status: Former    Current packs/day: 0.00    Average packs/day: 1 pack/day for 16.0 years (16.0 ttl pk-yrs)    Types: Cigarettes, Cigars    Start date: 07/18/1999    Quit date: 07/18/2015    Years since quitting: 8.3    Smokeless tobacco: Never   Tobacco comments:    July 2017 switched from cigarettes to vaping  Vaping Use   Vaping status: Every Day   Start date: 07/18/2015   Substances: Nicotine, Flavoring, Nicotine-salt  Substance Use Topics   Alcohol use: Yes    Alcohol/week: 3.0 - 4.0 standard drinks of alcohol    Types: 3 - 4 Cans of beer per week    Comment: occasional - 1x/month   Drug use: No    Family History  Problem Relation Age of Onset   Hypertension Father    Cirrhosis Father    Cancer Paternal Grandfather    Pancreatic cancer Paternal Grandfather    Bladder Cancer Paternal Grandfather    Hematuria Other        paternal   Kidney cancer Other        paternal   Prostate cancer Other        paternal   Kidney failure Other        paternal        12/07/2023    4:30 PM 11/13/2023    1:19 PM 10/04/2023    9:11 AM 09/06/2023    9:18 AM  GAD 7 : Generalized Anxiety Score  Nervous, Anxious, on Edge 0 0 0 0  Control/stop worrying 0 0 0 0  Worry too much - different things 0 0 0 0  Trouble relaxing 0 0 0 0  Restless 0 0 0 0  Easily annoyed or irritable 0 0 0 0  Afraid - awful might happen 0 0 0 0  Total GAD 7 Score 0 0 0 0  Anxiety Difficulty Not difficult at all Not difficult at all Not difficult at all Not difficult at all       12/07/2023    4:30 PM 11/13/2023    1:19 PM 10/04/2023    9:10 AM  Depression screen PHQ 2/9  Decreased Interest 0 0 0  Down, Depressed, Hopeless 0 0 0  PHQ - 2 Score 0 0 0    BP Readings from Last 3 Encounters:  12/07/23 122/88  11/13/23 108/84  10/04/23 118/82    Wt Readings from Last 3 Encounters:  12/07/23 239 lb (108.4 kg)  11/13/23 244 lb (110.7 kg)  10/04/23 256 lb (116.1 kg)    BP 122/88   Pulse 76   Temp 97.8 F (36.6 C)   Ht 5' 10 (1.778 m)   Wt 239 lb (108.4 kg)   SpO2 98%   BMI 34.29 kg/m   Physical Exam Vitals and nursing note reviewed.  Constitutional:      Appearance: Normal appearance.  HENT:     Right  Ear: Tympanic membrane and ear canal normal.     Left Ear: Tympanic membrane and ear canal normal.     Mouth/Throat:     Pharynx: Posterior oropharyngeal erythema (mild) present. No oropharyngeal exudate.  Cardiovascular:     Rate and Rhythm: Normal rate.  Pulmonary:     Effort: Pulmonary effort is normal.  Abdominal:     General: There is no distension.  Musculoskeletal:  General: Normal range of motion.  Skin:    General: Skin is warm and dry.  Neurological:     Mental Status: He is alert and oriented to person, place, and time.     Gait: Gait is intact.  Psychiatric:        Mood and Affect: Mood and affect normal.     Recent Labs     Component Value Date/Time   NA 142 11/10/2022 1505   K 4.7 11/10/2022 1505   CL 104 11/10/2022 1505   CO2 21 11/10/2022 1505   GLUCOSE 82 11/10/2022 1505   GLUCOSE 115 (H) 08/13/2019 1314   BUN 14 11/10/2022 1505   CREATININE 1.02 11/10/2022 1505   CALCIUM 9.3 11/10/2022 1505   PROT 7.1 03/17/2023 1338   ALBUMIN 4.4 03/17/2023 1338   AST 45 (H) 03/17/2023 1338   ALT 103 (H) 03/17/2023 1338   ALKPHOS 81 03/17/2023 1338   BILITOT 0.5 03/17/2023 1338   GFRNONAA >60 08/13/2019 1314   GFRAA >60 08/13/2019 1314    Lab Results  Component Value Date   WBC 6.2 11/10/2022   HGB 16.4 11/10/2022   HCT 49.1 11/10/2022   MCV 86 11/10/2022   PLT 396 11/10/2022   No results found for: HGBA1C Lab Results  Component Value Date   CHOL 194 11/10/2022   HDL 50 11/10/2022   LDLCALC 119 (H) 11/10/2022   TRIG 140 11/10/2022   CHOLHDL 3.9 11/10/2022   Lab Results  Component Value Date   TSH 0.876 11/10/2022      Assessment and Plan:  1. Acute viral laryngitis (Primary) Likely viral etiology. Discussed self-limited nature of viral illnesses and advised conservative measures including rest, fluids, honey, and OTC cough/cold medications. Contact precautions advised to limit spread. Encouraged mask wearing and good hand hygiene  especially before meals. Call if acutely worsening symptoms or if no improvement after Day 7 of illness.  Work note printed, return to work Monday.  Patient education printed.  Advised vocal rest.  Hoarseness will likely continue for 1 to 2 weeks     Rolan Hoyle, PA-C, DMSc, Nutritionist [facility] Primary Care and Sports Medicine [facility] [phone]

## 2023-12-25 ENCOUNTER — Encounter: Payer: Self-pay | Admitting: Physician Assistant

## 2023-12-25 ENCOUNTER — Ambulatory Visit (INDEPENDENT_AMBULATORY_CARE_PROVIDER_SITE_OTHER): Admitting: Physician Assistant

## 2023-12-25 VITALS — BP 122/88 | HR 64 | Temp 98.1°F | Ht 70.0 in | Wt 240.0 lb

## 2023-12-25 DIAGNOSIS — Z6836 Body mass index (BMI) 36.0-36.9, adult: Secondary | ICD-10-CM

## 2023-12-25 DIAGNOSIS — E66812 Obesity, class 2: Secondary | ICD-10-CM

## 2023-12-25 DIAGNOSIS — R7989 Other specified abnormal findings of blood chemistry: Secondary | ICD-10-CM

## 2023-12-25 MED ORDER — PHENTERMINE HCL 37.5 MG PO TABS: 37.5000 mg | ORAL_TABLET | Freq: Every day | ORAL | 0 refills | Status: DC

## 2023-12-25 NOTE — Assessment & Plan Note (Signed)
 Patient is doing impressively well with phentermine . Continue healthy lifestyle as he has been doing.   Reviewed that phentermine  is only approved for short-term use, so we will plan for 2 more months on this medication, after which he will need to either taper or switch to an alternative medication, perhaps Qsymia if not cost prohibitive.  We discussed that while phentermine  will help with his energy levels temporarily, it does not address the underlying problem.

## 2023-12-25 NOTE — Assessment & Plan Note (Signed)
 Encouraged to repeat labs as ordered.  He plans to do this tomorrow.

## 2023-12-25 NOTE — Progress Notes (Signed)
 Date:  12/25/2023   Name:  Glenn Serrano   DOB:  Feb 16, 1980   MRN:  [id number]   Chief Complaint: Weight Check (Gained 1 lbs /)  HPI  Glenn Serrano returns to clinic following up on weight management after starting phentermine  09/06/23, down 28 pounds since then. He takes 1 tablet each morning, and notices a big difference if he misses a dose. He has done extremely well with improvements in energy, activity, portion control.  He has no reported side effects, and in fact this has much improved his sleep.  Overall he feels much better on this medication.  As far as the root cause of his fatigue, I encouraged him to download the [facility] app at last visit.  He screened using this app for 3 nights in a row with no evidence of apnea, and his wife also does not report any choking or gasping.  The cause of his fatigue remains elusive.  Chronic LFT elevations, due for repeat labs ordered 11/13/2023.  He has no other concerns to address today.  Medication list has been reviewed and updated.  Current Meds  Medication Sig   [DISCONTINUED] phentermine  (ADIPEX-P ) 37.5 MG tablet Take 1 tablet (37.5 mg total) by mouth daily before breakfast.     Review of Systems  Patient Active Problem List   Diagnosis Date Noted   Class 2 severe obesity with serious comorbidity in adult 09/06/2023   Mild hyperlipidemia 09/06/2023   Vapes nicotine containing substance 09/06/2023   Snoring 09/06/2023   NAFLD (nonalcoholic fatty liver disease) 96/86/7974   Elevated LFTs 11/11/2022   Former smoker 06/01/2015   History of ureterolithiasis 07/02/2014   Hepatic hemangioma 07/02/2014    Allergies  Allergen Reactions   Amoxicillin Shortness Of Breath and Rash   Penicillins Hives and Shortness Of Breath     There is no immunization history on file for this patient.  Past Surgical History:  Procedure Laterality Date   APPENDECTOMY     CYSTOSCOPY W/ RETROGRADES Left 09/10/2019   Procedure: CYSTOSCOPY  WITH RETROGRADE PYELOGRAM;  Surgeon: Twylla Glendia BROCKS, MD;  Location: [facility];  Service: Urology;  Laterality: Left;   CYSTOSCOPY W/ RETROGRADES Left 09/24/2019   Procedure: CYSTOSCOPY WITH RETROGRADE PYELOGRAM;  Surgeon: Twylla Glendia BROCKS, MD;  Location: [facility];  Service: Urology;  Laterality: Left;   CYSTOSCOPY WITH STENT PLACEMENT Left 09/10/2019   Procedure: CYSTOSCOPY WITH STENT PLACEMENT;  Surgeon: Twylla Glendia BROCKS, MD;  Location: [facility];  Service: Urology;  Laterality: Left;   CYSTOSCOPY WITH URETHRAL DILATATION Left 09/10/2019   Procedure: CYSTOSCOPY WITH URETHRAL DILATATION;  Surgeon: Twylla Glendia BROCKS, MD;  Location: [facility];  Service: Urology;  Laterality: Left;   CYSTOSCOPY/URETEROSCOPY/HOLMIUM LASER/STENT PLACEMENT Left 09/24/2019   Procedure: CYSTOSCOPY/URETEROSCOPY/HOLMIUM LASER/STENT Exchange;  Surgeon: Twylla Glendia BROCKS, MD;  Location: [facility];  Service: Urology;  Laterality: Left;   DISTAL BICEPS TENDON REPAIR Left 05/06/2021   Procedure: Left distal biceps repair;  Surgeon: Tobie Priest, MD;  Location: [facility];  Service: Orthopedics;  Laterality: Left;   ELBOW SURGERY     EXCISION MASS UPPER EXTREMETIES Left 09/09/2021   Procedure: Left elbow heterotopic ossification excision;  Surgeon: Tobie Priest, MD;  Location: [facility];  Service: Orthopedics;  Laterality: Left;   MYRINGOTOMY WITH TUBE PLACEMENT Right 08/16/2018   Procedure: MYRINGOTOMY WITH TUBE PLACEMENT;  Surgeon: Juengel, Paul, MD;  Location: [facility];  Service: ENT;  Laterality: Right;   NASOPHARYNGOSCOPY EUSTATION TUBE BALLOON DILATION Bilateral 08/16/2018  Procedure: NASOPHARYNGOSCOPY EUSTATION TUBE BALLOON DILATION;  Surgeon: Edda Mt, MD;  Location: [facility];  Service: ENT;  Laterality: Bilateral;   TONSILLECTOMY     TURBINATE REDUCTION Bilateral 08/16/2018   Procedure: TURBINATE REDUCTION Outfracture inferior;  Surgeon: Edda Mt, MD;  Location: [facility];  Service:  ENT;  Laterality: Bilateral;   URETEROSCOPY Left 09/10/2019   Procedure: URETEROSCOPY;  Surgeon: Twylla Glendia BROCKS, MD;  Location: [facility];  Service: Urology;  Laterality: Left;    Social History   Tobacco Use   Smoking status: Former    Current packs/day: 0.00    Average packs/day: 1 pack/day for 16.0 years (16.0 ttl pk-yrs)    Types: Cigarettes, Cigars    Start date: 07/18/1999    Quit date: 07/18/2015    Years since quitting: 8.4   Smokeless tobacco: Never   Tobacco comments:    July 2017 switched from cigarettes to vaping  Vaping Use   Vaping status: Every Day   Start date: 07/18/2015   Substances: Nicotine, Flavoring, Nicotine-salt  Substance Use Topics   Alcohol use: Yes    Alcohol/week: 3.0 - 4.0 standard drinks of alcohol    Types: 3 - 4 Cans of beer per week    Comment: occasional - 1x/month   Drug use: No    Family History  Problem Relation Age of Onset   Hypertension Father    Cirrhosis Father    Cancer Paternal Grandfather    Pancreatic cancer Paternal Grandfather    Bladder Cancer Paternal Grandfather    Hematuria Other        paternal   Kidney cancer Other        paternal   Prostate cancer Other        paternal   Kidney failure Other        paternal        12/25/2023    1:30 PM 12/07/2023    4:30 PM 11/13/2023    1:19 PM 10/04/2023    9:11 AM  GAD 7 : Generalized Anxiety Score  Nervous, Anxious, on Edge 0 0 0 0  Control/stop worrying 0 0 0 0  Worry too much - different things 0 0 0 0  Trouble relaxing 0 0 0 0  Restless 0 0 0 0  Easily annoyed or irritable 0 0 0 0  Afraid - awful might happen 0 0 0 0  Total GAD 7 Score 0 0 0 0  Anxiety Difficulty Not difficult at all Not difficult at all Not difficult at all Not difficult at all       12/25/2023    1:30 PM 12/07/2023    4:30 PM 11/13/2023    1:19 PM  Depression screen PHQ 2/9  Decreased Interest 0 0 0  Down, Depressed, Hopeless 0 0 0  PHQ - 2 Score 0 0 0    BP Readings from Last 3  Encounters:  12/25/23 122/88  12/07/23 122/88  11/13/23 108/84    Wt Readings from Last 3 Encounters:  12/25/23 240 lb (108.9 kg)  12/07/23 239 lb (108.4 kg)  11/13/23 244 lb (110.7 kg)    BP 122/88   Pulse 64   Temp 98.1 F (36.7 C)   Ht 5' 10 (1.778 m)   Wt 240 lb (108.9 kg)   SpO2 98%   BMI 34.44 kg/m   Physical Exam Vitals and nursing note reviewed.  Constitutional:      Appearance: Normal appearance.  Cardiovascular:  Rate and Rhythm: Normal rate and regular rhythm.     Heart sounds: No murmur heard.    No friction rub. No gallop.  Pulmonary:     Effort: Pulmonary effort is normal.     Breath sounds: Normal breath sounds.  Abdominal:     General: There is no distension.  Musculoskeletal:        General: Normal range of motion.  Skin:    General: Skin is warm and dry.  Neurological:     Mental Status: He is alert and oriented to person, place, and time.     Gait: Gait is intact.  Psychiatric:        Mood and Affect: Mood and affect normal.     Recent Labs     Component Value Date/Time   NA 142 11/10/2022 1505   K 4.7 11/10/2022 1505   CL 104 11/10/2022 1505   CO2 21 11/10/2022 1505   GLUCOSE 82 11/10/2022 1505   GLUCOSE 115 (H) 08/13/2019 1314   BUN 14 11/10/2022 1505   CREATININE 1.02 11/10/2022 1505   CALCIUM 9.3 11/10/2022 1505   PROT 7.1 03/17/2023 1338   ALBUMIN 4.4 03/17/2023 1338   AST 45 (H) 03/17/2023 1338   ALT 103 (H) 03/17/2023 1338   ALKPHOS 81 03/17/2023 1338   BILITOT 0.5 03/17/2023 1338   GFRNONAA >60 08/13/2019 1314   GFRAA >60 08/13/2019 1314    Lab Results  Component Value Date   WBC 6.2 11/10/2022   HGB 16.4 11/10/2022   HCT 49.1 11/10/2022   MCV 86 11/10/2022   PLT 396 11/10/2022   No results found for: HGBA1C Lab Results  Component Value Date   CHOL 194 11/10/2022   HDL 50 11/10/2022   LDLCALC 119 (H) 11/10/2022   TRIG 140 11/10/2022   CHOLHDL 3.9 11/10/2022   Lab Results  Component Value Date   TSH  0.876 11/10/2022      Assessment and Plan:  Elevated LFTs Assessment & Plan: Encouraged to repeat labs as ordered.  He plans to do this tomorrow.   Class 2 severe obesity due to excess calories with serious comorbidity and body mass index (BMI) of 36.0 to 36.9 in adult Assessment & Plan: Patient is doing impressively well with phentermine . Continue healthy lifestyle as he has been doing.   Reviewed that phentermine  is only approved for short-term use, so we will plan for 2 more months on this medication, after which he will need to either taper or switch to an alternative medication, perhaps Qsymia if not cost prohibitive.  We discussed that while phentermine  will help with his energy levels temporarily, it does not address the underlying problem.   Orders: -     Phentermine  HCl; Take 1 tablet (37.5 mg total) by mouth daily before breakfast.  Dispense: 30 tablet; Refill: 0     Follow-up 2 months OV weight, fatigue  Rolan Hoyle, PA-C, DMSc, Nutritionist [facility] Primary [facility] and Sports [facility] [phone]

## 2024-02-13 ENCOUNTER — Other Ambulatory Visit: Payer: Self-pay | Admitting: Physician Assistant

## 2024-02-13 DIAGNOSIS — Z6836 Body mass index (BMI) 36.0-36.9, adult: Secondary | ICD-10-CM

## 2024-02-13 MED ORDER — PHENTERMINE HCL 37.5 MG PO TABS: 37.5000 mg | ORAL_TABLET | Freq: Every day | ORAL | 0 refills | Status: AC

## 2024-02-13 NOTE — Telephone Encounter (Signed)
 Please review.  KP

## 2024-02-19 ENCOUNTER — Ambulatory Visit: Admitting: Physician Assistant

## 2024-02-19 NOTE — Assessment & Plan Note (Signed)
 SABRA

## 2024-02-19 NOTE — Progress Notes (Unsigned)
 "   Date:  02/19/2024   Name:  Glenn Serrano   DOB:  1980/11/08   MRN:  [id number]   I connected with Glenn Serrano on 02/19/24 via MyChart Video and verified that I am speaking with the correct person using appropriate identifiers. The limitations, risks, security and privacy concerns of performing an evaluation and management service by MyChart Video, including the higher likelihood of inaccurate diagnoses and treatments, and the availability of in person appointments were reviewed. The possible need of an additional face-to-face encounter for complete and high quality delivery of care was discussed. The patient was also made aware that there may be a patient responsible charge related to this service. The patient expressed understanding and wishes to proceed.   Provider location is in medical facility [facility]). Patient location is at their home People involved in care of the patient during this telehealth encounter were myself, my CMA, and my front office/scheduling team member.    Chief Complaint: No chief complaint on file.  HPI   Glenn Serrano returns virtually today due to weather, following up on weight management after starting phentermine  09/06/23, down over 28 pounds since then. He takes 1 tablet each morning, and notices a big difference if he misses a dose. He has done extremely well with improvements in energy, activity, portion control.  He has no reported side effects, and in fact this has much improved his sleep.  Overall he feels much better on this medication.   As far as the root cause of his fatigue, this remains elusive. No evidence of sleep apnea per patient or his spouse. Labs have been normal.   We have discussed that long-term use of phentermine  is not FDA approved. Plan to switch to an alternative soon. Just refilled last week.    Chronic LFT elevations, due for repeat labs ordered 11/13/2023.  Medication list  has been reviewed and updated.  Active Medications[1]   Review of Systems  Patient Active Problem List   Diagnosis Date Noted   Class 2 severe obesity with serious comorbidity in adult 09/06/2023   Mild hyperlipidemia 09/06/2023   Vapes nicotine containing substance 09/06/2023   Snoring 09/06/2023   NAFLD (nonalcoholic fatty liver disease) 96/86/7974   Elevated LFTs 11/11/2022   Former smoker 06/01/2015   History of ureterolithiasis 07/02/2014   Hepatic hemangioma 07/02/2014    Allergies[2]   There is no immunization history on file for this patient.  Past Surgical History:  Procedure Laterality Date   APPENDECTOMY     CYSTOSCOPY W/ RETROGRADES Left 09/10/2019   Procedure: CYSTOSCOPY WITH RETROGRADE PYELOGRAM;  Surgeon: Twylla Glendia BROCKS, MD;  Location: [facility];  Service: Urology;  Laterality: Left;   CYSTOSCOPY W/ RETROGRADES Left 09/24/2019   Procedure: CYSTOSCOPY WITH RETROGRADE PYELOGRAM;  Surgeon: Twylla Glendia BROCKS, MD;  Location: [facility];  Service: Urology;  Laterality: Left;   CYSTOSCOPY WITH STENT PLACEMENT Left 09/10/2019   Procedure: CYSTOSCOPY WITH STENT PLACEMENT;  Surgeon: Twylla Glendia BROCKS, MD;  Location: [facility];  Service: Urology;  Laterality: Left;   CYSTOSCOPY WITH URETHRAL DILATATION Left 09/10/2019   Procedure: CYSTOSCOPY WITH URETHRAL DILATATION;  Surgeon: Twylla Glendia BROCKS, MD;  Location: [facility];  Service: Urology;  Laterality: Left;   CYSTOSCOPY/URETEROSCOPY/HOLMIUM LASER/STENT PLACEMENT Left 09/24/2019   Procedure: CYSTOSCOPY/URETEROSCOPY/HOLMIUM LASER/STENT Exchange;  Surgeon: Twylla Glendia BROCKS, MD;  Location: [facility];  Service: Urology;  Laterality: Left;   DISTAL BICEPS TENDON REPAIR Left 05/06/2021  Procedure: Left distal biceps repair;  Surgeon: Tobie Priest, MD;  Location: [facility];  Service: Orthopedics;  Laterality: Left;   ELBOW SURGERY     EXCISION MASS UPPER EXTREMETIES Left 09/09/2021   Procedure: Left elbow heterotopic ossification excision;   Surgeon: Tobie Priest, MD;  Location: [facility];  Service: Orthopedics;  Laterality: Left;   MYRINGOTOMY WITH TUBE PLACEMENT Right 08/16/2018   Procedure: MYRINGOTOMY WITH TUBE PLACEMENT;  Surgeon: Edda Mt, MD;  Location: [facility];  Service: ENT;  Laterality: Right;   NASOPHARYNGOSCOPY EUSTATION TUBE BALLOON DILATION Bilateral 08/16/2018   Procedure: NASOPHARYNGOSCOPY EUSTATION TUBE BALLOON DILATION;  Surgeon: Edda Mt, MD;  Location: [facility];  Service: ENT;  Laterality: Bilateral;   TONSILLECTOMY     TURBINATE REDUCTION Bilateral 08/16/2018   Procedure: TURBINATE REDUCTION Outfracture inferior;  Surgeon: Edda Mt, MD;  Location: [facility];  Service: ENT;  Laterality: Bilateral;   URETEROSCOPY Left 09/10/2019   Procedure: URETEROSCOPY;  Surgeon: Twylla Glendia BROCKS, MD;  Location: [facility];  Service: Urology;  Laterality: Left;    Social History[3]  Family History  Problem Relation Age of Onset   Hypertension Father    Cirrhosis Father    Cancer Paternal Grandfather    Pancreatic cancer Paternal Grandfather    Bladder Cancer Paternal Grandfather    Hematuria Other        paternal   Kidney cancer Other        paternal   Prostate cancer Other        paternal   Kidney failure Other        paternal        12/25/2023    1:30 PM 12/07/2023    4:30 PM 11/13/2023    1:19 PM 10/04/2023    9:11 AM  GAD 7 : Generalized Anxiety Score  Nervous, Anxious, on Edge 0  0  0  0   Control/stop worrying 0  0  0  0   Worry too much - different things 0  0  0  0   Trouble relaxing 0  0  0  0   Restless 0  0  0  0   Easily annoyed or irritable 0  0  0  0   Afraid - awful might happen 0  0  0  0   Total GAD 7 Score 0 0 0 0  Anxiety Difficulty Not difficult at all Not difficult at all Not difficult at all Not difficult at all     Data saved with a previous flowsheet row definition       12/25/2023    1:30 PM 12/07/2023    4:30 PM 11/13/2023     1:19 PM  Depression screen PHQ 2/9  Decreased Interest 0 0 0  Down, Depressed, Hopeless 0 0 0  PHQ - 2 Score 0 0 0    BP Readings from Last 3 Encounters:  12/25/23 122/88  12/07/23 122/88  11/13/23 108/84    Wt Readings from Last 3 Encounters:  12/25/23 240 lb (108.9 kg)  12/07/23 239 lb (108.4 kg)  11/13/23 244 lb (110.7 kg)    There were no vitals taken for this visit.  Physical Exam General: Speaking full sentences, no audible heavy breathing. Sounds alert and appropriately interactive. Well-appearing. Face symmetric. Extraocular movements intact. Pupils equal and round. No nasal flaring or accessory muscle use visualized.  Recent Labs     Component Value Date/Time   NA 142 11/10/2022 1505  K 4.7 11/10/2022 1505   CL 104 11/10/2022 1505   CO2 21 11/10/2022 1505   GLUCOSE 82 11/10/2022 1505   GLUCOSE 115 (H) 08/13/2019 1314   BUN 14 11/10/2022 1505   CREATININE 1.02 11/10/2022 1505   CALCIUM 9.3 11/10/2022 1505   PROT 7.1 03/17/2023 1338   ALBUMIN 4.4 03/17/2023 1338   AST 45 (H) 03/17/2023 1338   ALT 103 (H) 03/17/2023 1338   ALKPHOS 81 03/17/2023 1338   BILITOT 0.5 03/17/2023 1338   GFRNONAA >60 08/13/2019 1314   GFRAA >60 08/13/2019 1314    Lab Results  Component Value Date   WBC 6.2 11/10/2022   HGB 16.4 11/10/2022   HCT 49.1 11/10/2022   MCV 86 11/10/2022   PLT 396 11/10/2022   No results found for: HGBA1C Lab Results  Component Value Date   CHOL 194 11/10/2022   HDL 50 11/10/2022   LDLCALC 119 (H) 11/10/2022   TRIG 140 11/10/2022   CHOLHDL 3.9 11/10/2022   Lab Results  Component Value Date   TSH 0.876 11/10/2022     Assessment & Plan Class 2 severe obesity due to excess calories with serious comorbidity and body mass index (BMI) of 36.0 to 36.9 in adult     Elevated LFTs      No follow-ups on file.   I discussed the above assessment and treatment plan with the patient. The patient was provided an opportunity to ask  questions and all were answered. The patient agreed with the plan and demonstrated an understanding of the instructions. The patient was advised to call back or seek an in-person evaluation if the symptoms worsen or if the condition fails to improve as anticipated. I provided a total time of *** minutes inclusive of time utilized for medical chart review, information gathering, care coordination with staff, and documentation completion.  Rolan Hoyle, PA-C, DMSc, DipACLM, Nutritionist [facility] Primary Care and Sports Medicine [facility] [phone]    [1]  No outpatient medications have been marked as taking for the 02/19/24 encounter (Appointment) with Hoyle Toribio SQUIBB, PA.  [2]  Allergies Allergen Reactions   Amoxicillin Shortness Of Breath and Rash   Penicillins Hives and Shortness Of Breath  [3]  Social History Tobacco Use   Smoking status: Former    Current packs/day: 0.00    Average packs/day: 1 pack/day for 16.0 years (16.0 ttl pk-yrs)    Types: Cigarettes, Cigars    Start date: 07/18/1999    Quit date: 07/18/2015    Years since quitting: 8.5   Smokeless tobacco: Never   Tobacco comments:    July 2017 switched from cigarettes to vaping  Vaping Use   Vaping status: Every Day   Start date: 07/18/2015   Substances: Nicotine, Flavoring, Nicotine-salt  Substance Use Topics   Alcohol use: Yes    Alcohol/week: 3.0 - 4.0 standard drinks of alcohol    Types: 3 - 4 Cans of beer per week    Comment: occasional - 1x/month   Drug use: No   "

## 2024-11-13 ENCOUNTER — Encounter: Admitting: Physician Assistant
